# Patient Record
Sex: Male | Born: 1995 | Race: Black or African American | Hispanic: No | Marital: Single | Smoking: Current some day smoker
Health system: Southern US, Community
[De-identification: ages and names within clinical notes are randomized; demographics above are authoritative.]

## PROBLEM LIST (undated history)

## (undated) DIAGNOSIS — R569 Unspecified convulsions: Secondary | ICD-10-CM

## (undated) DIAGNOSIS — D496 Neoplasm of unspecified behavior of brain: Secondary | ICD-10-CM

## (undated) HISTORY — PX: TOOTH EXTRACTION: SUR596

## (undated) HISTORY — DX: Neoplasm of unspecified behavior of brain: D49.6

---

## 2016-06-06 ENCOUNTER — Emergency Department (HOSPITAL_COMMUNITY)
Admission: EM | Admit: 2016-06-06 | Discharge: 2016-06-07 | Disposition: A | Discharge status: Home / Self Care | Payer: Self-pay | Attending: Emergency Medicine | Admitting: Emergency Medicine

## 2016-06-06 DIAGNOSIS — L0291 Cutaneous abscess, unspecified: Secondary | ICD-10-CM

## 2016-06-06 DIAGNOSIS — Z79899 Other long term (current) drug therapy: Secondary | ICD-10-CM | POA: Insufficient documentation

## 2016-06-06 DIAGNOSIS — L02411 Cutaneous abscess of right axilla: Secondary | ICD-10-CM | POA: Insufficient documentation

## 2016-06-06 NOTE — ED Notes (Signed)
Bed: WTR7 Expected date:  Expected time:  Means of arrival:  Comments: 

## 2016-06-06 NOTE — ED Triage Notes (Signed)
Pt presents w/ non-draining abscess to right axilla.

## 2016-06-07 MED ORDER — CEPHALEXIN 500 MG PO CAPS: 500.0000 mg | ORAL_CAPSULE | Freq: Four times a day (QID) | ORAL | 0 refills | Status: DC

## 2016-06-07 MED ORDER — LIDOCAINE HCL 2 % IJ SOLN
10.0000 mL | Freq: Once | INTRAMUSCULAR | Status: AC
  Administered 2016-06-07: 200 mg via INTRADERMAL
  Filled 2016-06-07: qty 20

## 2016-06-07 MED ORDER — HYDROCODONE-ACETAMINOPHEN 5-325 MG PO TABS
2.0000 | ORAL_TABLET | Freq: Once | ORAL | Status: AC
  Administered 2016-06-07: 2 via ORAL
  Filled 2016-06-07: qty 2

## 2016-06-07 MED ORDER — HYDROCODONE-ACETAMINOPHEN 5-325 MG PO TABS: 2.0000 | ORAL_TABLET | ORAL | 0 refills | Status: DC | PRN

## 2016-06-07 MED ORDER — CEPHALEXIN 500 MG PO CAPS
500.0000 mg | ORAL_CAPSULE | Freq: Once | ORAL | Status: AC
  Administered 2016-06-07: 500 mg via ORAL
  Filled 2016-06-07: qty 1

## 2016-06-07 NOTE — Discharge Instructions (Signed)
Take antibiotics as directed.   Make sure to keep the wound clean and dry.  He did take the pain medication as instructed for pain.  Return to emergency Department in 2 days for wound recheck and have the packing removed. If it falls out before then it is okay.  Return the emergency Department sooner if he experiencing worsening swelling/redness to the area, redness that spreads to the arm, fever, any other worsening or concerning symptoms.  If you do not have a primary care doctor you see regularly, please you the list below. Please call them to arrange for follow-up.    No Primary Care Doctor Call [place] Other agencies that provide inexpensive medical care    [place]  [phone]    [facility]  [place]  [phone]    [facility]  [phone]    [facility]  [phone]    [facility]  [phone]

## 2016-06-07 NOTE — ED Provider Notes (Signed)
[place] DEPT Provider Note   CSN: [id number] Arrival date & time: 06/06/16  2250     History   Chief Complaint Chief Complaint  Patient presents with  . Abscess    Right Axilla    HPI Marco Scott is a 21 y.o. male who presents with 1 week of worsening swelling and redness to his right axilla. Patient states that initially the area was very small but over the last week it has continued to grow become more painful. He reports that he has not tried to express any drainage and has not noticed any drainage. He reports a history of abscesses but states that he has never had to get them I&D before.He denies any fever, numbness/weakness of his right upper extremity.   The history is provided by the patient.    No past medical history on file.  There are no active problems to display for this patient.   No past surgical history on file.     Home Medications    Prior to Admission medications   Medication Sig Start Date End Date Taking? Authorizing Provider  cephALEXin (KEFLEX) 500 MG capsule Take 1 capsule (500 mg total) by mouth 4 (four) times daily. 06/07/16   Volanda Napoleon, PA-C  HYDROcodone-acetaminophen (NORCO/VICODIN) 5-325 MG tablet Take 2 tablets by mouth every 4 (four) hours as needed. 06/07/16   Volanda Napoleon, PA-C    Family History No family history on file.  Social History Social History  Substance Use Topics  . Smoking status: Not on file  . Smokeless tobacco: Not on file  . Alcohol use Not on file     Allergies   Patient has no known allergies.   Review of Systems Review of Systems  Constitutional: Negative for fever.  Skin: Positive for wound.  Neurological: Negative for weakness and numbness.     Physical Exam Updated Vital Signs BP 109/70 (BP Location: Left Arm)   Pulse 76   Temp 98.3 F (36.8 C) (Oral)   Resp 18   Ht 5\' 8"  (1.727 m)   Wt 52.2 kg (115 lb)   SpO2 100%   BMI 17.49 kg/m   Physical Exam  Constitutional:  He appears well-developed and well-nourished.  HENT:  Head: Normocephalic and atraumatic.  Eyes: Conjunctivae and EOM are normal. Right eye exhibits no discharge. Left eye exhibits no discharge. No scleral icterus.  Cardiovascular:  Pulses:      Radial pulses are 2+ on the right side, and 2+ on the left side.  Pulmonary/Chest: Effort normal.  Musculoskeletal: He exhibits no deformity.  Neurological: He is alert.  Skin: Skin is warm and dry. Capillary refill takes less than 2 seconds.  3 cm area of erythema with central fluctuance to the right axilla. Tenderness palpation of the area. Active purulent drainage with applied pressure.  Psychiatric: He has a normal mood and affect. His speech is normal and behavior is normal.  Nursing note and vitals reviewed.    ED Treatments / Results  Labs (all labs ordered are listed, but only abnormal results are displayed) Labs Reviewed - No data to display  EKG  EKG Interpretation None       Radiology No results found.  Procedures .Marland KitchenIncision and Drainage Date/Time: 06/07/2016 1:51 AM Performed by: Providence Lanius A Authorized by: Providence Lanius A   Consent:    Consent obtained:  Verbal   Consent given by:  Patient   Risks discussed:  Incomplete drainage, bleeding and infection   Alternatives  discussed:  No treatment Location:    Type:  Abscess   Location:  Upper extremity   Upper extremity location:  Arm   Arm location:  R upper arm (axilla) Pre-procedure details:    Skin preparation:  Betadine Anesthesia (see MAR for exact dosages):    Anesthesia method:  Local infiltration   Local anesthetic:  Lidocaine 2% w/o epi Procedure type:    Complexity:  Simple Procedure details:    Incision types:  Single straight   Incision depth:  Submucosal   Scalpel blade:  11   Wound management:  Probed and deloculated and extensive cleaning   Drainage:  Purulent   Drainage amount:  Moderate   Wound treatment:  Drain placed   Packing  materials:  1/4 in iodoform gauze Post-procedure details:    Patient tolerance of procedure:  Tolerated well, no immediate complications   (including critical care time)  Medications Ordered in ED Medications  lidocaine (XYLOCAINE) 2 % (with pres) injection 200 mg (200 mg Intradermal Given by Other 06/07/16 0114)  HYDROcodone-acetaminophen (NORCO/VICODIN) 5-325 MG per tablet 2 tablet (2 tablets Oral Given 06/07/16 0152)  cephALEXin (KEFLEX) capsule 500 mg (500 mg Oral Given 06/07/16 0152)     Initial Impression / Assessment and Plan / ED Course  I have reviewed the triage vital signs and the nursing notes.  Pertinent labs & imaging results that were available during my care of the patient were reviewed by me and considered in my medical decision making (see chart for details).     21 year old male who presents with swelling and redness to right axilla. Patient is afebrile, non-toxic appearing, sitting comfortably on examination table. History/physical exam are consistent with an abscess to the right axilla. Given that there is a central area of fluctuance and active purulent drainage with applied pressure on physical exam will plan to I&D in the emergency department.  I&D as documented above. Moderate amount of purulent drainage. Packing was placed. Patient tolerated procedure well. Given surrounding erythema will plan to treat with antibiotic. Pain medication and first dose of antibiotic given here in the department. Patient given a short course of pain medication and antibiotics to go home with. Sterile dressing applied. Patient instructed to follow-up in the emergency Department in 2 days for wound recheck and packing removal. Patient is stable for discharge at this time. Strict return precautions discussed. Patient's persistent understanding and agreement to plan.  Final Clinical Impressions(s) / ED Diagnoses   Final diagnoses:  Abscess    New Prescriptions Discharge Medication List  as of 06/07/2016  1:44 AM    START taking these medications   Details  cephALEXin (KEFLEX) 500 MG capsule Take 1 capsule (500 mg total) by mouth 4 (four) times daily., Starting Tue 06/07/2016, Print    HYDROcodone-acetaminophen (NORCO/VICODIN) 5-325 MG tablet Take 2 tablets by mouth every 4 (four) hours as needed., Starting Tue 06/07/2016, Print         Volanda Napoleon, PA-C 06/07/16 0155    Drenda Freeze, MD 06/07/16 1336

## 2016-07-17 ENCOUNTER — Emergency Department (HOSPITAL_COMMUNITY)
Admission: EM | Admit: 2016-07-17 | Discharge: 2016-07-17 | Disposition: A | Discharge status: Home / Self Care | Payer: Self-pay | Attending: Emergency Medicine | Admitting: Emergency Medicine

## 2016-07-17 ENCOUNTER — Emergency Department (HOSPITAL_COMMUNITY): Payer: Self-pay

## 2016-07-17 ENCOUNTER — Encounter (HOSPITAL_COMMUNITY): Payer: Self-pay | Admitting: Emergency Medicine

## 2016-07-17 DIAGNOSIS — F17210 Nicotine dependence, cigarettes, uncomplicated: Secondary | ICD-10-CM | POA: Insufficient documentation

## 2016-07-17 DIAGNOSIS — G479 Sleep disorder, unspecified: Secondary | ICD-10-CM | POA: Insufficient documentation

## 2016-07-17 LAB — CBC
HEMATOCRIT: 40.9 % (ref 39.0–52.0)
Hemoglobin: 14.2 g/dL (ref 13.0–17.0)
MCH: 32.1 pg (ref 26.0–34.0)
MCHC: 34.7 g/dL (ref 30.0–36.0)
MCV: 92.5 fL (ref 78.0–100.0)
Platelets: 269 10*3/uL (ref 150–400)
RBC: 4.42 MIL/uL (ref 4.22–5.81)
RDW: 11.8 % (ref 11.5–15.5)
WBC: 9.4 10*3/uL (ref 4.0–10.5)

## 2016-07-17 LAB — RAPID URINE DRUG SCREEN, HOSP PERFORMED
AMPHETAMINES: NOT DETECTED
BENZODIAZEPINES: NOT DETECTED
Barbiturates: NOT DETECTED
Cocaine: NOT DETECTED
OPIATES: NOT DETECTED
Tetrahydrocannabinol: POSITIVE — AB

## 2016-07-17 LAB — BASIC METABOLIC PANEL
Anion gap: 6 (ref 5–15)
BUN: 15 mg/dL (ref 6–20)
CO2: 27 mmol/L (ref 22–32)
CREATININE: 1.06 mg/dL (ref 0.61–1.24)
Calcium: 9.5 mg/dL (ref 8.9–10.3)
Chloride: 104 mmol/L (ref 101–111)
GFR calc Af Amer: >60 mL/min (ref >60)
GLUCOSE: 86 mg/dL (ref 65–99)
POTASSIUM: 3.6 mmol/L (ref 3.5–5.1)
Sodium: 137 mmol/L (ref 135–145)

## 2016-07-17 LAB — CBG MONITORING, ED: GLUCOSE-CAPILLARY: 72 mg/dL (ref 65–99)

## 2016-07-17 LAB — ETHANOL: Alcohol, Ethyl (B): <5 mg/dL (ref <5)

## 2016-07-17 NOTE — Discharge Instructions (Signed)
This does not sound like a seizure. Your labs are reassuring. Your blood pressure is a low benefit from some oral fluids.

## 2016-07-17 NOTE — ED Triage Notes (Addendum)
Patient states that his roommate woke him up this morning states that he had seizure like activity while sleeping.  Patient was told he was shaking and moaning and bed was wet when he woke up. Patient reports headache this morning and thinks hit on wall .  Patient denies having any PMH of seizures.

## 2016-07-17 NOTE — ED Provider Notes (Signed)
[place] DEPT Provider Note   CSN: [id number] Arrival date & time: 07/17/16  1638     History   Chief Complaint Chief Complaint  Patient presents with  . possible seizure  . Headache    HPI Marco Scott is a 21 y.o. male.  HPI Patient presents after question will seizure activity. States that his roommate came into his room after hearing noises and he was stiffened up in bed. Reportedly was moaning and shaking. Has a headache and thinks he hit his head. Roommate told patient that patient was combative. Patient does not remember this. Sates he woke up he could see his roommate later but states he felt confused. No loss of bladder bowel control. Did not bite his tongue. That was supposed to let after the event from sweating and drooling. No history of seizures. Occasional marijuana use but not yesterday. Denies heavy alcohol use. Denies other drug use. Has been doing well otherwise.   History reviewed. No pertinent past medical history.  There are no active problems to display for this patient.   History reviewed. No pertinent surgical history.     Home Medications    Prior to Admission medications   Medication Sig Start Date End Date Taking? Authorizing Provider  cetirizine (ZYRTEC) 10 MG tablet Take 10 mg by mouth daily.   Yes [provider]    Family History No family history on file.  Social History Social History  Substance Use Topics  . Smoking status: Current Every Day Smoker    Types: Cigarettes  . Smokeless tobacco: Never Used  . Alcohol use Not on file     Allergies   Patient has no known allergies.   Review of Systems Review of Systems  Constitutional: Negative for appetite change.  HENT: Negative for congestion.   Eyes: Negative for photophobia.  Respiratory: Negative for shortness of breath.   Cardiovascular: Negative for chest pain.  Gastrointestinal: Negative for abdominal pain.  Genitourinary: Negative for flank pain.    Musculoskeletal: Negative for back pain.  Neurological: Positive for headaches.  Hematological: Negative for adenopathy.  Psychiatric/Behavioral: Negative for confusion.     Physical Exam Updated Vital Signs BP 98/60 (BP Location: Right Arm)   Pulse 71   Temp 98 F (36.7 C) (Oral)   Resp 18   Wt 50.5 kg (111 lb 4 oz)   SpO2 99%   BMI 16.92 kg/m   Physical Exam  Constitutional: He is oriented to person, place, and time. He appears well-developed.  HENT:  Head: Atraumatic.  No evidence of trauma to tongue  Neck: Neck supple.  Cardiovascular: Normal rate.   Pulmonary/Chest: Effort normal.  Abdominal: He exhibits no distension.  Musculoskeletal: Normal range of motion.  Neurological: He is alert and oriented to person, place, and time.  Skin: Skin is warm. Capillary refill takes less than 2 seconds.  Psychiatric: He has a normal mood and affect.     ED Treatments / Results  Labs (all labs ordered are listed, but only abnormal results are displayed) Labs Reviewed  RAPID URINE DRUG SCREEN, HOSP PERFORMED - Abnormal; Notable for the following:       Result Value   Tetrahydrocannabinol POSITIVE (*)    All other components within normal limits  BASIC METABOLIC PANEL  CBC  ETHANOL  CBG MONITORING, ED    EKG  EKG Interpretation None       Radiology Ct Head Wo Contrast  Result Date: 07/17/2016 CLINICAL DATA:  Headache EXAM: CT HEAD WITHOUT  CONTRAST TECHNIQUE: Contiguous axial images were obtained from the base of the skull through the vertex without intravenous contrast. COMPARISON:  None. FINDINGS: Brain: No acute intracranial abnormality. Specifically, no hemorrhage, hydrocephalus, mass lesion, acute infarction, or significant intracranial injury. Vascular: No hyperdense vessel or unexpected calcification. Skull: No acute calvarial abnormality. Sinuses/Orbits: Visualized paranasal sinuses and mastoids clear. Orbital soft tissues unremarkable. Other: None IMPRESSION:  Normal study. Electronically Signed   By: Rolm Baptise M.D.   On: 07/17/2016 12:50    Procedures Procedures (including critical care time)  Medications Ordered in ED Medications - No data to display   Initial Impression / Assessment and Plan / ED Course  I have reviewed the triage vital signs and the nursing notes.  Pertinent labs & imaging results that were available during my care of the patient were reviewed by me and considered in my medical decision making (see chart for details).      Patient with episode of agitation while sleeping. Sounds like some stiffness but does not sound as if he was actually seizing. Sounds like he was combative while he was stiffened up. Back at baseline now. Mild hypotension but labs reassuring. Patient states he thinks he  Is a little dehydrated. He is tolerating orals will be discharged home.  Final Clinical Impressions(s) / ED Diagnoses   Final diagnoses:  Sleep disturbance    New Prescriptions New Prescriptions   No medications on file     Davonna Belling, MD 07/17/16 1414

## 2016-11-06 ENCOUNTER — Encounter (HOSPITAL_COMMUNITY): Payer: Self-pay

## 2016-11-06 ENCOUNTER — Emergency Department (HOSPITAL_COMMUNITY)
Admission: EM | Admit: 2016-11-06 | Discharge: 2016-11-06 | Disposition: A | Discharge status: Home / Self Care | Payer: Self-pay | Attending: Emergency Medicine | Admitting: Emergency Medicine

## 2016-11-06 DIAGNOSIS — F17210 Nicotine dependence, cigarettes, uncomplicated: Secondary | ICD-10-CM | POA: Insufficient documentation

## 2016-11-06 DIAGNOSIS — R51 Headache: Secondary | ICD-10-CM | POA: Insufficient documentation

## 2016-11-06 DIAGNOSIS — R569 Unspecified convulsions: Secondary | ICD-10-CM | POA: Insufficient documentation

## 2016-11-06 DIAGNOSIS — Z79899 Other long term (current) drug therapy: Secondary | ICD-10-CM | POA: Insufficient documentation

## 2016-11-06 LAB — COMPREHENSIVE METABOLIC PANEL
ALT: 10 U/L — ABNORMAL LOW (ref 17–63)
AST: 29 U/L (ref 15–41)
Albumin: 4.2 g/dL (ref 3.5–5.0)
Alkaline Phosphatase: 61 U/L (ref 38–126)
Anion gap: 5 (ref 5–15)
BUN: 13 mg/dL (ref 6–20)
CO2: 25 mmol/L (ref 22–32)
Calcium: 9.3 mg/dL (ref 8.9–10.3)
Chloride: 102 mmol/L (ref 101–111)
Creatinine, Ser: 0.98 mg/dL (ref 0.61–1.24)
GFR calc Af Amer: >60 mL/min (ref >60)
GFR calc non Af Amer: >60 mL/min (ref >60)
Glucose, Bld: 87 mg/dL (ref 65–99)
Potassium: 4 mmol/L (ref 3.5–5.1)
Sodium: 132 mmol/L — ABNORMAL LOW (ref 135–145)
Total Bilirubin: 0.8 mg/dL (ref 0.3–1.2)
Total Protein: 7.5 g/dL (ref 6.5–8.1)

## 2016-11-06 LAB — CBC WITH DIFFERENTIAL/PLATELET
Basophils Absolute: 0 10*3/uL (ref 0.0–0.1)
Basophils Relative: 0 %
Eosinophils Absolute: 0.1 10*3/uL (ref 0.0–0.7)
Eosinophils Relative: 1 %
HCT: 39.5 % (ref 39.0–52.0)
Hemoglobin: 13.7 g/dL (ref 13.0–17.0)
Lymphocytes Relative: 23 %
Lymphs Abs: 1.2 10*3/uL (ref 0.7–4.0)
MCH: 32.5 pg (ref 26.0–34.0)
MCHC: 34.7 g/dL (ref 30.0–36.0)
MCV: 93.8 fL (ref 78.0–100.0)
Monocytes Absolute: 0.5 10*3/uL (ref 0.1–1.0)
Monocytes Relative: 10 %
Neutro Abs: 3.4 10*3/uL (ref 1.7–7.7)
Neutrophils Relative %: 66 %
Platelets: 235 10*3/uL (ref 150–400)
RBC: 4.21 MIL/uL — ABNORMAL LOW (ref 4.22–5.81)
RDW: 11.7 % (ref 11.5–15.5)
WBC: 5.1 10*3/uL (ref 4.0–10.5)

## 2016-11-06 NOTE — ED Provider Notes (Signed)
[facility] EMERGENCY DEPARTMENT Provider Note   CSN: [id number] Arrival date & time: 11/06/16  8676     History   Chief Complaint No chief complaint on file.   HPI Marco Scott is a 21 y.o. male who is previously healthy who presents with concern following a shaking episode that occurred this morning that was witnessed by the patient's roommate.  Patient does not remember the episode and was disoriented for time after.  He bit his tongue.  He did not have any bowel or bladder incontinence.  He reports he has had 4 episodes like this in the past 6-7 months, the first being in July of this year.  He was evaluated at that time and had a negative head CT.  Patient has since had 3 more episodes witnessed by 2 different roommates.  They report full body shaking.  Patient reports he always feels weird for the rest of the day and has a headache.  He reports that he sometimes smells strange smells prior to his episode of shaking.  Today he reports smelling a soapy, musty smell a little bit before his episode of shaking started.  He denies hitting his head today.  He was in bed when his shaking started.  He reports he smokes marijuana every day.  He states he rarely drinks alcohol.  HPI  History reviewed. No pertinent past medical history.  There are no active problems to display for this patient.   History reviewed. No pertinent surgical history.     Home Medications    Prior to Admission medications   Medication Sig Start Date End Date Taking? Authorizing Provider  cetirizine (ZYRTEC) 10 MG tablet Take 10 mg by mouth daily as needed for allergies.    Yes [provider]  ibuprofen (ADVIL,MOTRIN) 200 MG tablet Take 600 mg by mouth every 6 (six) hours as needed for headache.   Yes [provider]    Family History No family history on file.  Social History Social History  Substance Use Topics  . Smoking status: Current Every Day Smoker   Types: Cigarettes  . Smokeless tobacco: Never Used  . Alcohol use Not on file     Allergies   Patient has no known allergies.   Review of Systems Review of Systems  Constitutional: Negative for chills and fever.  HENT: Negative for facial swelling and sore throat.   Respiratory: Negative for shortness of breath.   Cardiovascular: Negative for chest pain.  Gastrointestinal: Negative for abdominal pain, nausea and vomiting.  Genitourinary: Negative for dysuria.  Musculoskeletal: Negative for back pain.  Skin: Negative for rash and wound.  Neurological: Positive for seizures (potentially) and headaches. Negative for dizziness, light-headedness and numbness.  Psychiatric/Behavioral: The patient is not nervous/anxious.      Physical Exam Updated Vital Signs BP 101/60   Pulse 69   Temp 98 F (36.7 C) (Oral)   Resp 10   Ht 5\' 8"  (1.727 m)   Wt 49.9 kg (110 lb)   SpO2 100%   BMI 16.73 kg/m   Physical Exam  Constitutional: He appears well-developed and well-nourished. No distress.  HENT:  Head: Normocephalic and atraumatic.  Mouth/Throat: Oropharynx is clear and moist. No oropharyngeal exudate.  Eyes: Pupils are equal, round, and reactive to light. Conjunctivae and EOM are normal. Right eye exhibits no discharge. Left eye exhibits no discharge. No scleral icterus.  Neck: Normal range of motion. Neck supple. No thyromegaly present.  Cardiovascular: Normal rate, regular rhythm,  normal heart sounds and intact distal pulses.  Exam reveals no gallop and no friction rub.   No murmur heard. Pulmonary/Chest: Effort normal and breath sounds normal. No stridor. No respiratory distress. He has no wheezes. He has no rales.  Abdominal: Soft. Bowel sounds are normal. He exhibits no distension. There is no tenderness. There is no rebound and no guarding.  Musculoskeletal: He exhibits no edema.  Lymphadenopathy:    He has no cervical adenopathy.  Neurological: He is alert. Coordination  normal.  CN 3-12 intact; normal sensation throughout; 5/5 strength in all 4 extremities; equal bilateral grip strength  Skin: Skin is warm and dry. No rash noted. He is not diaphoretic. No pallor.  Psychiatric: He has a normal mood and affect.  Nursing note and vitals reviewed.    ED Treatments / Results  Labs (all labs ordered are listed, but only abnormal results are displayed) Labs Reviewed  COMPREHENSIVE METABOLIC PANEL - Abnormal; Notable for the following:       Result Value   Sodium 132 (*)    ALT 10 (*)    All other components within normal limits  CBC WITH DIFFERENTIAL/PLATELET - Abnormal; Notable for the following:    RBC 4.21 (*)    All other components within normal limits    EKG  EKG Interpretation  Date/Time:  Sunday November 06 2016 11:52:54 EDT Ventricular Rate:  77 PR Interval:    QRS Duration: 91 QT Interval:  368 QTC Calculation: 417 R Axis:   92 Text Interpretation:  Sinus rhythm Borderline right axis deviation RSR' in V1 or V2, probably normal variant No old tracing to compare Confirmed by Dorie Rank [phone]) on 11/06/2016 11:54:36 AM       Radiology No results found.  Procedures Procedures (including critical care time)  Medications Ordered in ED Medications - No data to display   Initial Impression / Assessment and Plan / ED Course  I have reviewed the triage vital signs and the nursing notes.  Pertinent labs & imaging results that were available during my care of the patient were reviewed by me and considered in my medical decision making (see chart for details).     Patient presenting with suspected seizure.  Labs are unremarkable.  Patient had negative head CT in July for similar symptoms.  EKG shows NSR.  Considering no seizure in the ED and cannot confirm true seizure activity, will refer to neurology for further evaluation with EEG and treatment thereafter.  Patient given resources.  He is advised not to drive, swim, climb to high  heights until seizure-free for 6 months or told otherwise by neurologist.  Return precautions discussed.  Patient understands and agrees with plan.  Patient vitals stable throughout ED course and discharged in satisfactory condition.  I discussed patient case with Dr. Tomi Bamberger who guided the patient's management and agrees with plan.  Final Clinical Impressions(s) / ED Diagnoses   Final diagnoses:  Seizure-like activity [facility])    New Prescriptions Discharge Medication List as of 11/06/2016  1:20 PM       Frederica Kuster, PA-C 11/06/16 1414    Dorie Rank, MD 11/07/16 [phone]

## 2016-11-06 NOTE — ED Triage Notes (Signed)
Patient wants to be checked for possible seizures. States that his roommates describe body jerking x 3 months and he doesn't remember any of this. Today had reported same with tongue biting, no incontinence. Alert and oriented on arrival, complains of headache.

## 2016-11-06 NOTE — Discharge Instructions (Signed)
Do not drive, swim, or climb to high heights until you have been seizure-free for 6 months.  Please follow-up with neurology for further evaluation and treatment of your suspected seizures.  Please return to the emergency department if you develop any new or worsening symptoms.

## 2016-12-17 ENCOUNTER — Inpatient Hospital Stay (HOSPITAL_COMMUNITY)
Admission: EM | Admit: 2016-12-17 | Discharge: 2016-12-19 | DRG: 101 | Disposition: A | Discharge status: Home / Self Care | Payer: Self-pay | Attending: Family Medicine | Admitting: Family Medicine

## 2016-12-17 ENCOUNTER — Encounter (HOSPITAL_COMMUNITY): Payer: Self-pay

## 2016-12-17 ENCOUNTER — Observation Stay (HOSPITAL_COMMUNITY): Payer: Self-pay

## 2016-12-17 ENCOUNTER — Other Ambulatory Visit: Payer: Self-pay

## 2016-12-17 DIAGNOSIS — Z823 Family history of stroke: Secondary | ICD-10-CM

## 2016-12-17 DIAGNOSIS — F1210 Cannabis abuse, uncomplicated: Secondary | ICD-10-CM

## 2016-12-17 DIAGNOSIS — G930 Cerebral cysts: Secondary | ICD-10-CM | POA: Diagnosis present

## 2016-12-17 DIAGNOSIS — D649 Anemia, unspecified: Secondary | ICD-10-CM | POA: Insufficient documentation

## 2016-12-17 DIAGNOSIS — Z8249 Family history of ischemic heart disease and other diseases of the circulatory system: Secondary | ICD-10-CM

## 2016-12-17 DIAGNOSIS — R569 Unspecified convulsions: Principal | ICD-10-CM

## 2016-12-17 DIAGNOSIS — Z809 Family history of malignant neoplasm, unspecified: Secondary | ICD-10-CM

## 2016-12-17 DIAGNOSIS — G9389 Other specified disorders of brain: Secondary | ICD-10-CM

## 2016-12-17 DIAGNOSIS — Z720 Tobacco use: Secondary | ICD-10-CM

## 2016-12-17 DIAGNOSIS — F17200 Nicotine dependence, unspecified, uncomplicated: Secondary | ICD-10-CM

## 2016-12-17 DIAGNOSIS — F17210 Nicotine dependence, cigarettes, uncomplicated: Secondary | ICD-10-CM | POA: Diagnosis present

## 2016-12-17 HISTORY — DX: Unspecified convulsions: R56.9

## 2016-12-17 LAB — CBC WITH DIFFERENTIAL/PLATELET
BASOS PCT: 0 %
Basophils Absolute: 0 10*3/uL (ref 0.0–0.1)
EOS ABS: 0.1 10*3/uL (ref 0.0–0.7)
EOS PCT: 1 %
HCT: 37.8 % — ABNORMAL LOW (ref 39.0–52.0)
HEMOGLOBIN: 12.8 g/dL — AB (ref 13.0–17.0)
Lymphocytes Relative: 17 %
Lymphs Abs: 1.2 10*3/uL (ref 0.7–4.0)
MCH: 31.6 pg (ref 26.0–34.0)
MCHC: 33.9 g/dL (ref 30.0–36.0)
MCV: 93.3 fL (ref 78.0–100.0)
Monocytes Absolute: 0.7 10*3/uL (ref 0.1–1.0)
Monocytes Relative: 10 %
NEUTROS PCT: 72 %
Neutro Abs: 4.9 10*3/uL (ref 1.7–7.7)
PLATELETS: 256 10*3/uL (ref 150–400)
RBC: 4.05 MIL/uL — AB (ref 4.22–5.81)
RDW: 11.7 % (ref 11.5–15.5)
WBC: 6.9 10*3/uL (ref 4.0–10.5)

## 2016-12-17 LAB — COMPREHENSIVE METABOLIC PANEL
ALBUMIN: 3.8 g/dL (ref 3.5–5.0)
ALT: 12 U/L — ABNORMAL LOW (ref 17–63)
ANION GAP: 7 (ref 5–15)
AST: 26 U/L (ref 15–41)
Alkaline Phosphatase: 59 U/L (ref 38–126)
BUN: 14 mg/dL (ref 6–20)
CO2: 24 mmol/L (ref 22–32)
Calcium: 9.4 mg/dL (ref 8.9–10.3)
Chloride: 104 mmol/L (ref 101–111)
Creatinine, Ser: 1.05 mg/dL (ref 0.61–1.24)
GFR calc non Af Amer: >60 mL/min (ref >60)
GLUCOSE: 61 mg/dL — AB (ref 65–99)
POTASSIUM: 3.4 mmol/L — AB (ref 3.5–5.1)
SODIUM: 135 mmol/L (ref 135–145)
Total Bilirubin: 0.7 mg/dL (ref 0.3–1.2)
Total Protein: 6.8 g/dL (ref 6.5–8.1)

## 2016-12-17 LAB — RAPID URINE DRUG SCREEN, HOSP PERFORMED
AMPHETAMINES: NOT DETECTED
BARBITURATES: NOT DETECTED
BENZODIAZEPINES: NOT DETECTED
COCAINE: NOT DETECTED
Opiates: NOT DETECTED
TETRAHYDROCANNABINOL: POSITIVE — AB

## 2016-12-17 LAB — PROTIME-INR
INR: 1.08
Prothrombin Time: 13.9 seconds (ref 11.4–15.2)

## 2016-12-17 MED ORDER — ACETAMINOPHEN 650 MG RE SUPP: 650.0000 mg | RECTAL | Status: DC | PRN

## 2016-12-17 MED ORDER — KETOROLAC TROMETHAMINE 30 MG/ML IJ SOLN
30.0000 mg | Freq: Once | INTRAMUSCULAR | Status: AC
  Administered 2016-12-17: 30 mg via INTRAVENOUS
  Filled 2016-12-17: qty 1

## 2016-12-17 MED ORDER — GADOBENATE DIMEGLUMINE 529 MG/ML IV SOLN
10.0000 mL | Freq: Once | INTRAVENOUS | Status: AC | PRN
  Administered 2016-12-17: 10 mL via INTRAVENOUS

## 2016-12-17 MED ORDER — ONDANSETRON HCL 4 MG PO TABS
4.0000 mg | ORAL_TABLET | Freq: Four times a day (QID) | ORAL | Status: DC | PRN
  Administered 2016-12-18 (×2): 4 mg via ORAL
  Filled 2016-12-17 (×2): qty 1

## 2016-12-17 MED ORDER — LORAZEPAM 2 MG/ML IJ SOLN: 0.5000 mg | INTRAMUSCULAR | Status: DC | PRN

## 2016-12-17 MED ORDER — SODIUM CHLORIDE 0.9 % IV BOLUS (SEPSIS)
1000.0000 mL | Freq: Once | INTRAVENOUS | Status: AC
  Administered 2016-12-17: 1000 mL via INTRAVENOUS

## 2016-12-17 MED ORDER — ONDANSETRON HCL 4 MG/2ML IJ SOLN: 4.0000 mg | Freq: Four times a day (QID) | INTRAMUSCULAR | Status: DC | PRN

## 2016-12-17 MED ORDER — LORAZEPAM 2 MG/ML IJ SOLN
0.5000 mg | Freq: Once | INTRAMUSCULAR | Status: AC
  Administered 2016-12-17: 0.5 mg via INTRAVENOUS
  Filled 2016-12-17: qty 1

## 2016-12-17 MED ORDER — ACETAMINOPHEN 325 MG PO TABS
650.0000 mg | ORAL_TABLET | ORAL | Status: DC | PRN
  Administered 2016-12-18: 650 mg via ORAL
  Filled 2016-12-17: qty 2

## 2016-12-17 MED ORDER — IBUPROFEN 200 MG PO TABS
600.0000 mg | ORAL_TABLET | Freq: Four times a day (QID) | ORAL | Status: DC | PRN
  Administered 2016-12-18: 600 mg via ORAL
  Filled 2016-12-17: qty 3

## 2016-12-17 MED ORDER — POLYETHYLENE GLYCOL 3350 17 G PO PACK: 17.0000 g | PACK | Freq: Every day | ORAL | Status: DC | PRN

## 2016-12-17 MED ORDER — SODIUM CHLORIDE 0.9 % IV SOLN
1000.0000 mg | Freq: Once | INTRAVENOUS | Status: AC
  Administered 2016-12-17: 1000 mg via INTRAVENOUS
  Filled 2016-12-17: qty 10

## 2016-12-17 MED ORDER — SODIUM CHLORIDE 0.9 % IV SOLN
75.0000 mL/h | INTRAVENOUS | Status: DC
  Administered 2016-12-17: 75 mL/h via INTRAVENOUS

## 2016-12-17 NOTE — ED Notes (Signed)
Pt rolling around in the bed crying and yelling that "it feels like something is trying to bust out of my head". EDP made aware.

## 2016-12-17 NOTE — Consult Note (Addendum)
Neurology Consultation  Reason for Consult: Seizures Referring Physician: Dr. Lita Mains  CC: Seizures  History is obtained from: Patient, friend, mother, chart  HPI: Marco Scott is a 21 y.o. male was no significant past medical history but has a history of now at least a fourth witnessed seizure since July 2018, who came in for evaluation of a seizure that happened this afternoon. According to the family, he has had at least 4 seizures that have been witnessed by some friends since July 2018.  He was evaluated in the emergency room with a CAT scan at some point.  He was prescribed Keppra, which he took for 1 month and did not refill and did not follow-up with neurology. He was incarcerated for a while and has been out with an ankle monitoring system on him. He denies any head injury.  Denies any contact sport history.  Mother was at the bedside and reports normal pregnancy when she was pregnant with him.  He had normal growth and development. Uses marijuana at least 3-4 times a week.  Denies heroin cocaine or other drugs. Reports that he has this feeling of his vision becoming black, and his hearing becoming a little prior to these episodes.  Other than the 4 witnessed episodes, there have been at least 2-3 episodes where he is been found down on the floor and he does not remember anything about those episodes. Also has been complaining of headaches after the seizures.  Describes the headaches as sharp pain behind his eyes.  No particular aggravating or relieving factors but happens only after the seizures.  ROS: A 14 point ROS was performed and is negative except as noted in the HPI.   History reviewed. No pertinent past medical history. No past medical history  No family history on file. Significant family history of multiple family members with pancreatic cancer, brain cancer, strokes and diabetes.  Social History:   reports that he has been smoking cigarettes.  he has never used  smokeless tobacco. He reports that he does not use drugs. His alcohol history is not on file. Uses marijuana. Smokes a cigar once every 3 days  Medications No current facility-administered medications for this encounter.   Current Outpatient Medications:  .  cetirizine (ZYRTEC) 10 MG tablet, Take 10 mg by mouth daily as needed for allergies. , Disp: , Rfl:  .  ibuprofen (ADVIL,MOTRIN) 200 MG tablet, Take 600 mg by mouth every 6 (six) hours as needed for headache., Disp: , Rfl:   Exam: Current vital signs: BP 109/79   Pulse 80   Temp 97.7 F (36.5 C) (Oral)   Resp 18   Ht 5\' 8"  (1.727 m)   Wt 52.2 kg (115 lb)   SpO2 100%   BMI 17.49 kg/m   Vital signs in last 24 hours: Temp:  [97.7 F (36.5 C)] 97.7 F (36.5 C) (12/01 1513) Pulse Rate:  [77-80] 80 (12/01 1515) Resp:  [12-18] 18 (12/01 1515) BP: (96-109)/(63-79) 109/79 (12/01 1515) SpO2:  [100 %] 100 % (12/01 1515) Weight:  [52.2 kg (115 lb)] 52.2 kg (115 lb) (12/01 1511)  GENERAL: Awake, alert in NAD HEENT: - Normocephalic and atraumatic, dry mm, no LN++, no Thyromegally.  Tongue bite on the right lateral tongue, neck supple LUNGS - Clear to auscultation bilaterally with no wheezes CV - S1S2 RRR, no m/r/g, equal pulses bilaterally. ABDOMEN - Soft, nontender, nondistended with normoactive BS Ext: warm, well perfused, intact peripheral pulses, no edema, ankle bracelet tracker in place.  NEURO:  Mental Status: AA&Ox3  Language: speech is clear.  Naming, repetition, fluency, and comprehension intact. Cranial Nerves: PERRL. EOMI, visual fields full, no facial asymmetry, facial sensation intact, hearing intact, tongue/uvula/soft palate midline, normal sternocleidomastoid and trapezius muscle strength. No evidence of tongue atrophy or fibrillations Motor: 5/5 all over. Tone: is normal and bulk is normal Sensation- Intact to light touch bilaterally Coordination: FTN intact bilaterally, no ataxia in BLE. Gait- deferred  NIHSS  - 0  Labs I have reviewed labs in epic and the results pertinent to this consultation are: Significant for anemia CBC    Component Value Date/Time   WBC 6.9 12/17/2016 1552   RBC 4.05 (L) 12/17/2016 1552   HGB 12.8 (L) 12/17/2016 1552   HCT 37.8 (L) 12/17/2016 1552   PLT 256 12/17/2016 1552   MCV 93.3 12/17/2016 1552   MCH 31.6 12/17/2016 1552   MCHC 33.9 12/17/2016 1552   RDW 11.7 12/17/2016 1552   LYMPHSABS 1.2 12/17/2016 1552   MONOABS 0.7 12/17/2016 1552   EOSABS 0.1 12/17/2016 1552   BASOSABS 0.0 12/17/2016 1552    CMP     Component Value Date/Time   NA 135 12/17/2016 1552   K 3.4 (L) 12/17/2016 1552   CL 104 12/17/2016 1552   CO2 24 12/17/2016 1552   GLUCOSE 61 (L) 12/17/2016 1552   BUN 14 12/17/2016 1552   CREATININE 1.05 12/17/2016 1552   CALCIUM 9.4 12/17/2016 1552   PROT 6.8 12/17/2016 1552   ALBUMIN 3.8 12/17/2016 1552   AST 26 12/17/2016 1552   ALT 12 (L) 12/17/2016 1552   ALKPHOS 59 12/17/2016 1552   BILITOT 0.7 12/17/2016 1552   GFRNONAA >60 12/17/2016 1552   GFRAA >60 12/17/2016 1552    Imaging I have reviewed the images obtained:  CT-scan of the brain done on7/1/18 -ve for acute process.  Assessment:  21 year old man with no past medical history presenting for evaluation of episode of witnessed seizure by his friend. A good description of seizures not available but he says that he has what sounds to be an auditory and visual aura prior to these episodes.  He has also had episodes that have gone unwitnessed and he has woken up on the floor without any knowledge of it. His exam is essentially normal with the exception of tongue bite on the right lateral tongue, which is strongly suggestive of a seizure bite. He reports using marijuana regularly.  Denies other illicit drugs.  No history of significant head injuries.  No family history of seizures Also has headaches after the seizures.  Impression: -Evaluate for seizures -Might require outpatient  long-term EEG as none of his seizure episodes have been completely witnessed by anybody but most people have only seen it towards the end. -Headache, likely post-ictal  Recommendations: Given the multiplicity of events, continue with Keppra 500 twice daily.  He has already been loaded with Keppra 1 g IV in the emergency room. Obtain routine EEG Obtain MRI with and without contrast Counseled on toabcco and marijuana cessation Symptomatic headache management with Tylenol and IV fluids  Seizure precautions, including [place] state law about no driving for 6 months.  Discussed in detail with patient, and his family members at bedside.  They all verbalized understanding.  Per [facility] statutes, patients with seizures are not allowed to drive until they have been seizure-free for six months.  Use caution when using heavy equipment or power tools. Avoid working on ladders or at heights. Take showers  instead of baths. Ensure the water temperature is not too high on the home water heater. Do not go swimming alone. Do not lock yourself in a room alone (i.e. bathroom). When caring for infants or small children, sit down when holding, feeding, or changing them to minimize risk of injury to the child in the event you have a seizure. Maintain good sleep hygiene. Avoid alcohol.  If patient has another seizure, call 911 and bring them back to the ED if: A. The seizure lasts longer than 5 minutes.  B. The patient doesn't wake shortly after the seizure or has new problems such as difficulty seeing, speaking or moving following the seizure C. The patient was injured during the seizure D. The patient has a temperature over 102 F (39C) E. The patient vomited during the seizure and now is having trouble breathing  -- Amie Portland, MD [facility] [phone] If 7pm to 7am, please call on call as listed on AMION.

## 2016-12-17 NOTE — ED Notes (Signed)
[employer]: [phone] to place ankle bracelet back on

## 2016-12-17 NOTE — ED Notes (Signed)
Attempted to call report x2

## 2016-12-17 NOTE — ED Provider Notes (Signed)
[facility] EMERGENCY DEPARTMENT Provider Note   CSN: [id number] Arrival date & time: 12/17/16  1455     History   Chief Complaint Chief Complaint  Patient presents with  . Seizures    HPI Marco Scott is a 21 y.o. male.  HPI Patient presents with a witnessed seizure while at work.  He is amnestic to the event.  States prior to seizure he had a headache and light sensitivity.  Patient was confused afterward.  Unknown length of seizure-like activity but described as shaking by coworkers.  No incontinence.  Patient had 4 similarly witnessed episodes since he was originally evaluated in July.  So has had several unwitnessed syncopal episodes where he will wake up on the floor.  He had a CT in July which was normal.  Patient admits to frequent marijuana use but denies any other drug or alcohol use.  Denies frequent caffeinated or energy drinks.  Currently denies headache, no focal weakness or numbness.  No neck pain or stiffness. History reviewed. No pertinent past medical history.  Patient Active Problem List   Diagnosis Date Noted  . Seizure ([place]) 12/17/2016    History reviewed. No pertinent surgical history.     Home Medications    Prior to Admission medications   Medication Sig Start Date End Date Taking? Authorizing Provider  cetirizine (ZYRTEC) 10 MG tablet Take 10 mg by mouth daily as needed for allergies.     [provider]  ibuprofen (ADVIL,MOTRIN) 200 MG tablet Take 600 mg by mouth every 6 (six) hours as needed for headache.    [provider]    Family History No family history on file.  Social History Social History   Tobacco Use  . Smoking status: Current Every Day Smoker    Types: Cigarettes  . Smokeless tobacco: Never Used  Substance Use Topics  . Alcohol use: Not on file  . Drug use: No     Allergies   Patient has no known allergies.   Review of Systems Review of Systems  Constitutional: Negative for  chills and fever.  HENT: Negative for facial swelling.   Eyes: Positive for photophobia.  Respiratory: Negative for shortness of breath.   Cardiovascular: Negative for chest pain.  Gastrointestinal: Negative for abdominal pain, diarrhea, nausea and vomiting.  Genitourinary: Negative for flank pain and frequency.  Musculoskeletal: Negative for back pain, myalgias and neck pain.  Skin: Negative for rash and wound.  Neurological: Positive for seizures, syncope and headaches. Negative for dizziness, weakness, light-headedness and numbness.  Psychiatric/Behavioral: Positive for confusion.  All other systems reviewed and are negative.    Physical Exam Updated Vital Signs BP 109/79   Pulse 80   Temp 97.7 F (36.5 C) (Oral)   Resp 18   Ht 5\' 8"  (1.727 m)   Wt 52.2 kg (115 lb)   SpO2 100%   BMI 17.49 kg/m   Physical Exam  Constitutional: He is oriented to person, place, and time. He appears well-developed and well-nourished. No distress.  HENT:  Head: Normocephalic and atraumatic.  Mouth/Throat: Oropharynx is clear and moist.  Small abrasion to the right lateral side of the tongue.  Eyes: EOM are normal. Pupils are equal, round, and reactive to light.  Neck: Normal range of motion. Neck supple.  No posterior midline cervical tenderness to palpation.  No meningismus.  Cardiovascular: Normal rate and regular rhythm. Exam reveals no gallop and no friction rub.  No murmur heard. Pulmonary/Chest: Effort normal and breath  sounds normal. No stridor. No respiratory distress. He has no wheezes. He has no rales. He exhibits no tenderness.  Abdominal: Soft. Bowel sounds are normal. There is no tenderness. There is no rebound and no guarding.  Musculoskeletal: Normal range of motion. He exhibits no edema or tenderness.  No midline thoracic or lumbar tenderness.  No lower extremity swelling, asymmetry or tenderness.  Neurological: He is alert and oriented to person, place, and time.  Patient is  alert and oriented x3 with clear, goal oriented speech. Patient has 5/5 motor in all extremities. Sensation is intact to light touch.  Skin: Skin is warm and dry. Capillary refill takes less than 2 seconds. No rash noted. He is not diaphoretic. No erythema.  Psychiatric: He has a normal mood and affect. His behavior is normal.  Nursing note and vitals reviewed.    ED Treatments / Results  Labs (all labs ordered are listed, but only abnormal results are displayed) Labs Reviewed  CBC WITH DIFFERENTIAL/PLATELET - Abnormal; Notable for the following components:      Result Value   RBC 4.05 (*)    Hemoglobin 12.8 (*)    HCT 37.8 (*)    All other components within normal limits  COMPREHENSIVE METABOLIC PANEL - Abnormal; Notable for the following components:   Potassium 3.4 (*)    Glucose, Bld 61 (*)    ALT 12 (*)    All other components within normal limits  RAPID URINE DRUG SCREEN, HOSP PERFORMED  PROTIME-INR    EKG  EKG Interpretation None       Radiology No results found.  Procedures Procedures (including critical care time)  Medications Ordered in ED Medications  LORazepam (ATIVAN) injection 0.5 mg (not administered)  ketorolac (TORADOL) 30 MG/ML injection 30 mg (not administered)  levETIRAcetam (KEPPRA) 1,000 mg in sodium chloride 0.9 % 100 mL IVPB (1,000 mg Intravenous New Bag/Given 12/17/16 1608)  sodium chloride 0.9 % bolus 1,000 mL (1,000 mLs Intravenous New Bag/Given 12/17/16 1557)     Initial Impression / Assessment and Plan / ED Course  I have reviewed the triage vital signs and the nursing notes.  Pertinent labs & imaging results that were available during my care of the patient were reviewed by me and considered in my medical decision making (see chart for details).    Discussed with neurology who evaluated the patient in the emergency department.  Recommend loading with IV Keppra getting MRI and EEG and overnight admission.  Discussed with family medicine  service who will admit.   Final Clinical Impressions(s) / ED Diagnoses   Final diagnoses:  Seizure [facility])    ED Discharge Orders    None       Julianne Rice, MD 12/17/16 1705

## 2016-12-17 NOTE — H&P (Signed)
[facility] Admission History and Physical Service Pager: [phone]  Patient name: Marco Scott Medical record number: [id number] Date of birth: 12/14/1995 Age: 21 y.o. Gender: male  Primary Care Provider: Patient, No Pcp Per Consultants: neurology Code Status: full  Chief Complaint: seizure  Assessment and Plan: Marco Scott is a 21 y.o. male presenting with seizure episode earlier today. PMH is significant for allergies, daily marijuana use.  Witnessed seizure- history strongly suspicious of true seizures coupled with bite on his tongue however no one in room had witnessed an episode and just described the episodes of shaking. Patient does describe seizure aura and post-ictal state. BMP largely normal, CBC with mild anemia. Glucose 61. Neurology consulted in ED, keppra load ordered and will admit for seizure work up. Patient received 0.5 mg ativan in ED for agitation and persistent headache. -place in observation, attending Dr. Erin Hearing -vitals per floor w/ pulse ox -q4 hour neuro checks -seizure precautions -obtain EEG -continue Keppra oral 500 mg BID -MRI w/wo contrast (of note patient has ankle monitoring bracelet on and will need this removed prior to MRI) -tylenol or ibuprofen as needed for headache -if further seizures will give IV ativan -counseled on no driving for 6 months  Headache- per patient typically occur after seizure episodes. Received toradol in ED. -tylenol as needed  -IVF at maintenance  Marijuana use- frequent marijuana use reported -encourage cessation -UDS pending  Tobacco use- current every day smoker -encourage cessation -can order nicotine patch if patient needs it  Anemia- mild anemia noted on labs, hemoglobin 12.8, MCV 93.3. In July Hgb was 14.2.  -am CBC  Allergies- seasonal -cetirizine as needed   FEN/GI: regular diet Prophylaxis: early ambulation  Disposition: pending work up   History of Present  Illness:  Marco Scott is a 21 y.o. male presenting with seizure earlier today while at work.  Patient was at work and felt fine. He then noticed his hearing was "fading" and his vision was going black. Was found on floor by co-workers shaking. They called EMS. He is currently complaining of headache behind his eyes mostly. Reports he has had multiple episodes similar to this since July of this year. Some have been witnessed by his roommates, others he has just been found on the floor and he has been confused and can't remember what happened.   This episode no loss of bladder or bowel function. He did bite his tongue. He was confused for 5-10 minutes afterwards but that resolved. Headache remains and he reports he always gets headaches with his prior seizure episodes.  He first experienced a seizure like event in July, was evaluated in ED and had normal work up including normal head CT. He came back in October for seizure and was discharged to follow up with neurology but he was afraid he couldn't afford it and did not go.   Frequent to daily marijuana use otherwise no significant [place]  Review Of Systems: Per HPI with the following additions:   Review of Systems  Constitutional: Negative for chills and fever.  HENT: Negative for congestion and sore throat.   Eyes: Negative for blurred vision and double vision.  Respiratory: Negative for cough and shortness of breath.   Cardiovascular: Negative for chest pain.  Gastrointestinal: Negative for constipation, nausea and vomiting.  Genitourinary: Negative for dysuria, frequency and urgency.  Musculoskeletal: Negative for myalgias.  Skin: Negative for rash.  Neurological: Positive for seizures, loss of consciousness and headaches. Negative for tremors, sensory change  and speech change.  Psychiatric/Behavioral: Positive for substance abuse. Negative for hallucinations.    Patient Active Problem List   Diagnosis Date Noted  . Seizure ([place])  12/17/2016    Past Medical History: History reviewed. No pertinent past medical history.  Past Surgical History: History reviewed. No pertinent surgical history.  Social History: Social History   Tobacco Use  . Smoking status: Current Every Day Smoker    Types: Cigarettes  . Smokeless tobacco: Never Used  Substance Use Topics  . Alcohol use: Not on file  . Drug use: No   Additional social history: lives with roommate, smokes marijuana frequently, sexually active with 1 male partner  Please also refer to relevant sections of EMR.  Family History: No family history on file.  No FH of seizures known to family. FH of heart disease, cancer, and strokes in grandparents.  Allergies and Medications: No Known Allergies No current facility-administered medications on file prior to encounter.    Current Outpatient Medications on File Prior to Encounter  Medication Sig Dispense Refill  . cetirizine (ZYRTEC) 10 MG tablet Take 10 mg by mouth daily as needed for allergies.     Marland Kitchen ibuprofen (ADVIL,MOTRIN) 200 MG tablet Take 600 mg by mouth every 6 (six) hours as needed for headache.      Objective: BP 109/79   Pulse 80   Temp 97.7 F (36.5 C) (Oral)   Resp 18   Ht 5\' 8"  (1.727 m)   Wt 115 lb (52.2 kg)   SpO2 100%   BMI 17.49 kg/m  Exam: General: laying in bed, sleeping but arousable, in NAD Eyes: PERRLA, EOMI ENTM: MMM, tongue laceration on right side Neck: supple, non-tender, no LAD Cardiovascular: RRR, no MRG Respiratory: CTAB. No increased WOB Gastrointestinal: soft, non-tender, non-distended, +BS MSK: moves all extremities equally Derm: warm, dry, no rashes or lesions appreciated Neuro: AOx3, no focal deficits, strength and sensation in tact throughout Psych: appropriate mood and affect  Labs and Imaging: CBC BMET  Recent Labs  Lab 12/17/16 1552  WBC 6.9  HGB 12.8*  HCT 37.8*  PLT 256   Recent Labs  Lab 12/17/16 1552  NA 135  K 3.4*  CL 104  CO2 24   BUN 14  CREATININE 1.05  GLUCOSE 61*  CALCIUM 9.4      Steve Rattler, DO 12/17/2016, 4:59 PM PGY-2, [place] [facility] pager: [phone], text pages welcome

## 2016-12-17 NOTE — ED Notes (Signed)
Pt brought in by [facility] for an unwitnessed seizure that occurred while pt was at work. Pt states he sat on the ground because he got really sleepy, and the next thing he remembers is being at the hospital. Pt has had seizure-like activity for the past 4 months and was dx with seizures during a previous visit. Pt was given follow up information for a neurologist but never followed up. Pt is currently A+Ox4 and his only complaint is of a headache. Pt does not take any daily medications. Pt states before all of his episodes he gets a sudden severe headache and becomes very "tired".

## 2016-12-17 NOTE — ED Notes (Signed)
Attempted to call report x 1  

## 2016-12-17 NOTE — ED Notes (Signed)
Pt arrived via [facility] and when at the bridge waiting to be registered, jumped off of the stretcher and attempted to run out of the EMS bay. Pt states he does not know where he is but "I know you people are trying to help me".

## 2016-12-18 ENCOUNTER — Encounter (HOSPITAL_COMMUNITY): Payer: Self-pay

## 2016-12-18 ENCOUNTER — Observation Stay (HOSPITAL_COMMUNITY): Payer: Self-pay

## 2016-12-18 DIAGNOSIS — G939 Disorder of brain, unspecified: Secondary | ICD-10-CM

## 2016-12-18 DIAGNOSIS — F17200 Nicotine dependence, unspecified, uncomplicated: Secondary | ICD-10-CM

## 2016-12-18 LAB — RETICULOCYTES
RBC.: 3.84 MIL/uL — ABNORMAL LOW (ref 4.22–5.81)
RETIC COUNT ABSOLUTE: 49.9 10*3/uL (ref 19.0–186.0)
Retic Ct Pct: 1.3 % (ref 0.4–3.1)

## 2016-12-18 LAB — CBC
HEMATOCRIT: 34.5 % — AB (ref 39.0–52.0)
Hemoglobin: 11.6 g/dL — ABNORMAL LOW (ref 13.0–17.0)
MCH: 31.7 pg (ref 26.0–34.0)
MCHC: 33.6 g/dL (ref 30.0–36.0)
MCV: 94.3 fL (ref 78.0–100.0)
PLATELETS: 245 10*3/uL (ref 150–400)
RBC: 3.66 MIL/uL — ABNORMAL LOW (ref 4.22–5.81)
RDW: 11.8 % (ref 11.5–15.5)
WBC: 5.8 10*3/uL (ref 4.0–10.5)

## 2016-12-18 LAB — IRON AND TIBC
IRON: 121 ug/dL (ref 45–182)
Saturation Ratios: 52 % — ABNORMAL HIGH (ref 17.9–39.5)
TIBC: 231 ug/dL — ABNORMAL LOW (ref 250–450)
UIBC: 110 ug/dL

## 2016-12-18 LAB — VITAMIN B12: Vitamin B-12: 197 pg/mL (ref 180–914)

## 2016-12-18 LAB — BASIC METABOLIC PANEL
ANION GAP: 5 (ref 5–15)
BUN: 12 mg/dL (ref 6–20)
CALCIUM: 8.6 mg/dL — AB (ref 8.9–10.3)
CO2: 20 mmol/L — AB (ref 22–32)
Chloride: 112 mmol/L — ABNORMAL HIGH (ref 101–111)
Creatinine, Ser: 0.95 mg/dL (ref 0.61–1.24)
Glucose, Bld: 79 mg/dL (ref 65–99)
Potassium: 3.6 mmol/L (ref 3.5–5.1)
Sodium: 137 mmol/L (ref 135–145)

## 2016-12-18 LAB — FERRITIN: Ferritin: 78 ng/mL (ref 24–336)

## 2016-12-18 LAB — HIV ANTIBODY (ROUTINE TESTING W REFLEX): HIV SCREEN 4TH GENERATION: NONREACTIVE

## 2016-12-18 LAB — FOLATE: FOLATE: 19.6 ng/mL (ref >5.9)

## 2016-12-18 MED ORDER — DEXAMETHASONE SODIUM PHOSPHATE 4 MG/ML IJ SOLN: 4.0000 mg | Freq: Four times a day (QID) | INTRAMUSCULAR | Status: DC

## 2016-12-18 MED ORDER — PHENOL 1.4 % MT LIQD
1.0000 | OROMUCOSAL | Status: DC | PRN
  Filled 2016-12-18: qty 177

## 2016-12-18 MED ORDER — LEVETIRACETAM 500 MG PO TABS
500.0000 mg | ORAL_TABLET | Freq: Two times a day (BID) | ORAL | Status: DC
  Administered 2016-12-18 – 2016-12-19 (×3): 500 mg via ORAL
  Filled 2016-12-18 (×3): qty 1

## 2016-12-18 NOTE — Procedures (Signed)
ELECTROENCEPHALOGRAM REPORT  Date of Study: 12/18/16  Patient's Name: Marco Scott MRN: [id number] Date of Birth: 25-Dec-1995  Referring Provider: Julianne Rice, MD  Clinical History: [name] is a 21 y.o. male without significant past medical history but has a history of now at least a fourth witnessed seizure since July 2018, who came in for evaluation of a seizure.  Has been prescribed Keppra in the past. Reports that he has this feeling of his vision becoming black, and his hearing becoming a little prior to these episodes.  Other than the 4 witnessed episodes, there have been at least 2-3 episodes where he is been found down on the floor and he does not remember anything about those episodes. Also has been complaining of headaches after the seizures.    Medications: Scheduled Meds: . levETIRAcetam  500 mg Oral BID   Continuous Infusions: PRN Meds:.acetaminophen **OR** acetaminophen, ibuprofen, LORazepam, ondansetron **OR** ondansetron (ZOFRAN) IV, polyethylene glycol            Technical Summary: This is a standard 16 channel EEG recording performed according to the international 10-20 electrode system.  AP bipolar, transverse bipolar, and referential montages were obtained, and digitally reformatted as necessary.  Duration of Tracing: 22:04  Description: In the awake state there is a 10 Hz alpha rhythm seen from the posterior head regions in a symmetric fashion.  Eye blink artifact noted.  Drowsiness is noted by dropout of background alpha and vertex slowing, and stage 2 sleep with sleep spindles.   Hyperventilation and photic stimulation were performed without notable changes.  EKG was monitored and noted to be sinus rhythym with an average heart rate of 78 bpm.  Impression: This is a normal EEG in the awake, drowsy, and sleep states without any epileptiform abnormalities noted.  A single normal EEG does not exclude the diagnosis of epilepsy.  Clinical correlation  advised.   Carvel Getting, M.D.

## 2016-12-18 NOTE — Progress Notes (Signed)
PROGRESS NOTE  EEG completed and reported  normal.  No new recs.  C/w Keppra  F/U with Dr. Mickeal Skinner. Primary team to contact Monday.  Inpatient neurology will be available as needed.  Please call with questions.  Amie Portland, MD [facility] [phone] If 7pm to 7am, please call on call as listed on AMION.

## 2016-12-18 NOTE — Progress Notes (Signed)
EEG completed, results pending. 

## 2016-12-18 NOTE — Plan of Care (Signed)
  Health Behavior/Discharge Planning: Ability to manage health-related needs will improve 12/18/2016 2029 - Progressing by Irish Lack, RN   Clinical Measurements: Ability to maintain clinical measurements within normal limits will improve 12/18/2016 2029 - Progressing by Irish Lack, RN   Safety: Ability to remain free from injury will improve 12/18/2016 2029 - Progressing by Irish Lack, RN

## 2016-12-18 NOTE — Progress Notes (Signed)
Family Medicine Teaching Service Daily Progress Note Intern Pager: [phone]  Patient name: Marco Scott Medical record number: [id number] Date of birth: December 23, 1995 Age: 21 y.o. Gender: male  Primary Care Provider: Patient, No Pcp Per Consultants: neurology  Code Status: Full   Assessment and Plan: Marco Scott is a 21 y.o. male presenting with seizure episode earlier today. PMH is significant for allergies, daily marijuana use.  Witnessed seizure- history strongly suspicious of true seizures. Patient does describe seizure aura and post-ictal state.  Neurology consulted in ED, keppra load ordered and will admit for seizure work up. MRI w/wo contrast - 17 x 14 x 15 mm cortically based well-circumscribed cystic mass involving the anterior left superior frontal gyrus as above. -q4 hour neuro checks -seizure precautions -obtain EEG -continue Keppra oral 500 mg BID -neurology rec  Headache- per patient typically occur after seizure episodes. Received toradol in ED. -tylenol and ibuprofen for headaches  -IVF at maintenance  Marijuana use- frequent marijuana use reported -encourage cessation -UDS positive   Tobacco use- current every day smoker -encourage cessation -can order nicotine patch if patient needs it  Anemia- In July Hgb was 14.2. Mild Anemia - hgb 11.6  - Anemia panel  - FOBT - HIV  Allergies- seasonal -cetirizine as needed   FEN/GI: regular diet Prophylaxis: early ambulation  Disposition: Home   Subjective:  Patient doing well. States he was a little nauseated this morning, better with ginger ale. Concerned about the tumor neurology discussed with him. Tried to provide reassurance   Objective: Temp:  [97.7 F (36.5 C)-98.2 F (36.8 C)] 98.2 F (36.8 C) (12/02 0400) Pulse Rate:  [65-89] 67 (12/02 0400) Resp:  [12-20] 18 (12/02 0400) BP: (84-109)/(42-79) 102/56 (12/02 0400) SpO2:  [93 %-100 %] 99 % (12/02 0400) Weight:  [115 lb (52.2 kg)-115 lb 15.4  oz (52.6 kg)] 115 lb 15.4 oz (52.6 kg) (12/01 2040) Physical Exam: General: Well appearing, NAD Cardiovascular: RRR, no murmurs  Respiratory: CTAB, normal WOB  Abdomen: BS+, non-tender, non -distended  Extremities: No lower extremity edema   Laboratory: Recent Labs  Lab 12/17/16 1552 12/18/16 0316  WBC 6.9 5.8  HGB 12.8* 11.6*  HCT 37.8* 34.5*  PLT 256 245   Recent Labs  Lab 12/17/16 1552 12/18/16 0316  NA 135 137  K 3.4* 3.6  CL 104 112*  CO2 24 20*  BUN 14 12  CREATININE 1.05 0.95  CALCIUM 9.4 8.6*  PROT 6.8  --   BILITOT 0.7  --   ALKPHOS 59  --   ALT 12*  --   AST 26  --   GLUCOSE 61* 79     Imaging/Diagnostic Tests: Mr Jeri Cos Wo Contrast  Result Date: 12/17/2016 CLINICAL DATA:  Initial evaluation for acute seizure. EXAM: MRI HEAD WITHOUT AND WITH CONTRAST TECHNIQUE: Multiplanar, multiecho pulse sequences of the brain and surrounding structures were obtained without and with intravenous contrast. CONTRAST:  32mL MULTIHANCE GADOBENATE DIMEGLUMINE 529 MG/ML IV SOLN COMPARISON:  Prior head CT from 07/17/2016. FINDINGS: Brain: Cerebral volume within normal limits for age. There is a well-circumscribed slightly expansile T2 hyperintense cortically based cystic lesion at the parasagittal anterior left frontal lobe at the level of the superior frontal gyrus, measuring 17 x 14 x 15 mm (AP by transverse by craniocaudad). Lesion appears to be intra-axial, and demonstrates minimal internal complexity. No associated diffusion abnormality or susceptibility artifact. No appreciable enhancement on post-contrast sequences. No significant edema within the surrounding brain parenchyma. No obvious cortical dysplasia or  organizational abnormality within this region. Upon review of prior CT, this lesion was present on that exam, in overall similar in size. Note made of a few probable punctate calcifications within this lesion on prior CT. Cerebral volume within normal limits. No other focal  parenchymal signal abnormality. No foci of restricted diffusion to suggest acute or subacute ischemia. Gray-white matter differentiation otherwise well maintained. No encephalomalacia to suggest chronic infarction. No foci of susceptibility artifact to suggest acute or chronic intracranial hemorrhage. No other mass lesion. No midline shift or mass effect. Ventricles normal in size without hydrocephalus. No extra-axial fluid collection. Major dural sinuses are grossly patent. Pituitary gland suprasellar region grossly normal. Midline structures intact and normal. Vascular: Major intracranial vascular flow voids are well maintained. Skull and upper cervical spine: Craniocervical junction normal. Upper cervical spine within normal limits. Bone marrow signal intensity normal. No scalp soft tissue abnormality. Sinuses/Orbits: Globes and orbital soft tissues within normal limits. Mild scattered mucosal thickening within the ethmoidal air cells and maxillary sinuses. Paranasal sinuses are otherwise clear. No mastoid effusion. Inner ear structures grossly normal. Other: None. IMPRESSION: 1. 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 No associated edema or mass effect. Finding is nonspecific, with primary differential considerations consisting of possible DNET versus ganglioglioma. No disorganization or migrational abnormality to suggest focal cortical dysplasia. While lesions such as oligodendroglioma and PXA could also conceivably be considered, these are less favored given the relative lack of intrinsic and adjacent dural enhancement. 2. Otherwise normal MRI of the brain. Electronically Signed   By: Jeannine Boga M.D.   On: 12/17/2016 23:44    Tonette Bihari, MD 12/18/2016, 8:31 AM PGY-3, [facility] Intern pager: [phone], text pages welcome

## 2016-12-18 NOTE — Progress Notes (Signed)
[employer]: called at [phone] to place ankle bracelet back on.  No answer, message left on machine.  Call back number left

## 2016-12-18 NOTE — Progress Notes (Signed)
NEURO HOSPITALIST PROGRESS NOTE  Subjective: No family at bedside. Patient found laying in bed in NAD. Continues to complain of frontal H/a, pain behind his eyes and generalized weakness. States feels nauseous when he tries to eat. No new acute events or seizure activity reported overnight  Exam: Temp:  [97.7 F (36.5 C)-98.5 F (36.9 C)] 98.5 F (36.9 C) (12/02 1002) Pulse Rate:  [65-89] 77 (12/02 1002) Resp:  [12-20] 18 (12/02 1002) BP: (84-115)/(42-79) 115/62 (12/02 1002) SpO2:  [93 %-100 %] 100 % (12/02 1002) Weight:  [52.2 kg (115 lb)-52.6 kg (115 lb 15.4 oz)] 52.6 kg (115 lb 15.4 oz) (12/01 2040)  GENERAL: Awake, alert in NAD HEENT: - Normocephalic and atraumatic, Tongue bite on the right lateral tongue, neck supple LUNGS - Clear to auscultation bilaterally with no wheezes CV - S1S2 RRR, no m/r/g, equal pulses bilaterally. ABDOMEN - Soft, nontender, nondistended with normoactive BS Ext: warm, well perfused, intact peripheral pulses, no edema.  NEURO:  Mental Status: AA&Ox3  Language: speech is clear.  Naming, repetition, fluency, and comprehension intact. Cranial Nerves: PERRL. EOMI, visual fields full, no facial asymmetry, facial sensation intact, hearing intact, tongue/uvula/soft palate midline, normal sternocleidomastoid and trapezius muscle strength. No evidence of tongue atrophy or fibrillations Motor: 5/5 all over. Some poor effort on strength testing Tone: is normal and bulk is normal Sensation- Intact to light touch bilaterally Coordination: FTN intact bilaterally, no ataxia in BLE. Gait- deferred. No truncal ataxia noted  Pertinent Labs/Diagnostics: No results for input(s): GLUCAP in the last 168 hours. Recent Labs  Lab 12/17/16 1552 12/18/16 0316  NA 135 137  K 3.4* 3.6  CL 104 112*  CO2 24 20*  GLUCOSE 61* 79  BUN 14 12  CREATININE 1.05 0.95  CALCIUM 9.4 8.6*   Recent Labs  Lab 12/17/16 1552 12/18/16 0316  WBC 6.9 5.8  NEUTROABS  4.9  --   HGB 12.8* 11.6*  HCT 37.8* 34.5*  MCV 93.3 94.3  PLT 256 245   IMAGING: I have personally reviewed the radiological images below and agree with the radiology interpretations. CT-scan of the brain done on7/1/18 -ve for acute process.  Mr Jeri Cos Wo Contrast Result Date: 12/17/2016 IMPRESSION: 1. 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 No associated edema or mass effect. Finding is nonspecific, with primary differential considerations consisting of possible DNET versus ganglioglioma. No disorganization or migrational abnormality to suggest focal cortical dysplasia. While lesions such as oligodendroglioma and PXA could also conceivably be considered, these are less favored given the relative lack of intrinsic and adjacent dural enhancement. 2. Otherwise normal MRI of the brain.   EEG -     PENDING  Assessment:  21 year old man with no past medical history presenting for evaluation of episode of witnessed seizure by his friend. A good description of seizures not available but he says that he has what sounds to be an auditory and visual aura prior to these episodes.  He has also had episodes that have gone unwitnessed and he has woken up on the floor without any knowledge of it. His exam is essentially normal with the exception of tongue bite on the right lateral tongue, which is strongly suggestive of a seizure bite. He reports using marijuana regularly.  Denies other illicit drugs.  No history of significant head injuries.  No family history of  seizures Also has headaches after the seizures. 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  Impression: -Evaluate for seizures- may be secondary to brain lesion -Might require outpatient long-term EEG as none of his seizure episodes have been completely witnessed by anybody but most people have only seen it towards the  end. -Headache,  post-ictal vs pressure from brain lesion  12/18/16: MRI results/ brain lesion reviewed with patient. Will contact Neuro Oncology Dr Mickeal Skinner for consultation. No seizure activity reported overnight. EEG remains pending. Continues to C/o H/A, eye pain and generalized weakness.  Recommendations: -Consult Neuro Oncology on Monday - Dr Earnest Conroy. Mike Gip. Could consider neurosugery consult after consultation with Dr. Mickeal Skinner.  -Continue with Keppra 500 twice daily.  -Routine EEG -  Completed. Formal read pending. -Symptomatic headache management with Tylenol and IV fluids -PT consultation Seizure precautions, including [place] state law about no driving for 6 months.  Discussed in detail with patient, and his family members at bedside.  They all verbalized understanding.  Per [facility] statutes, patients with seizures are not allowed to drive until they have been seizure-free for six months.  Use caution when using heavy equipment or power tools. Avoid working on ladders or at heights. Take showers instead of baths. Ensure the water temperature is not too high on the home water heater. Do not go swimming alone. Do not lock yourself in a room alone (i.e. bathroom). When caring for infants or small children, sit down when holding, feeding, or changing them to minimize risk of injury to the child in the event you have a seizure. Maintain good sleep hygiene. Avoid alcohol.  If patient has another seizure, call 911 and bring them back to the ED if: A. The seizure lasts longer than 5 minutes.  B. The patient doesn't wake shortly after the seizure or has new problems such as difficulty seeing, speaking or moving following the seizure C. The patient was injured during the seizure D. The patient has a temperature over 102 F (39C) E. The patient vomited during the seizure and now is having trouble breathing  Mary Sella. ANP-C [facility] 12/18/2016, 11:01 AM If  7pm to 7am, please call on call as listed on AMION.  Attending Neurohospitalist Addendum Patient seen and examined. Agree with the history and physical as documented above. Agree with the plan as documented, which I helped formulate. I have independently examined the patient, gathered history, review of systems and reviewed images. I have made changes to the note above to reflect my plan.   --- Amie Portland, MD [facility] [phone]  If 7pm to 7am, please call on call as listed on AMION.

## 2016-12-19 DIAGNOSIS — G4089 Other seizures: Secondary | ICD-10-CM

## 2016-12-19 DIAGNOSIS — G9389 Other specified disorders of brain: Secondary | ICD-10-CM

## 2016-12-19 MED ORDER — LEVETIRACETAM 500 MG PO TABS: 500.0000 mg | ORAL_TABLET | Freq: Two times a day (BID) | ORAL | 0 refills | Status: DC

## 2016-12-19 NOTE — Consult Note (Signed)
[name] CONSULT NOTE  Patient Care Team: Patient, No Pcp Per as PCP - General (General Practice)  CHIEF COMPLAINTS/PURPOSE OF CONSULTATION:  Seizure Abnormal Brain MRI  HISTORY OF PRESENTING ILLNESS:  Marco Scott 21 y.o. male presented on day of arrival after being found down by coworkers, with shaking consistent with a seizure.  Apparently there had been several prior similar events since the summer, although all had occurred at home in the evening. When he arrived to the ED he was confused and combative.  He eventually returned to baseline status after administration of Keppra, ativan.  A subsequent MRI brain demonstrated a non-enhancing well circumscribed lesion in the mesial left frontal lobe.  Today, he has no formal complaints and feels ready for discharge.  He denies neurologic deficits at this time.    MEDICAL HISTORY:  Past Medical History:  Diagnosis Date  . Seizure ([place])    FOR LAST 4 MONTHS    SURGICAL HISTORY: History reviewed. No pertinent surgical history.  SOCIAL HISTORY: Social History   Socioeconomic History  . Marital status: Single    Spouse name: Not on file  . Number of children: Not on file  . Years of education: Not on file  . Highest education level: Not on file  Social Needs  . Financial resource strain: Somewhat hard  . Food insecurity - worry: Patient refused  . Food insecurity - inability: Patient refused  . Transportation needs - medical: Patient refused  . Transportation needs - non-medical: Patient refused  Occupational History  . Not on file  Tobacco Use  . Smoking status: Current Every Day Smoker    Types: Cigarettes  . Smokeless tobacco: Never Used  Substance and Sexual Activity  . Alcohol use: No    Frequency: Never  . Drug use: No  . Sexual activity: Not Currently  Other Topics Concern  . Not on file  Social History Narrative   Currently on house arrest and wearing ankle monitor    FAMILY HISTORY: History  reviewed. No pertinent family history.  ALLERGIES:  has No Known Allergies.  MEDICATIONS:  Current Facility-Administered Medications  Medication Dose Route Frequency Provider Last Rate Last Dose  . acetaminophen (TYLENOL) tablet 650 mg  650 mg Oral Q4H PRN Lucila Maine C, DO   650 mg at 12/18/16 1056   Or  . acetaminophen (TYLENOL) suppository 650 mg  650 mg Rectal Q4H PRN Lucila Maine C, DO      . ibuprofen (ADVIL,MOTRIN) tablet 600 mg  600 mg Oral Q6H PRN Lucila Maine C, DO   600 mg at 12/18/16 1827  . levETIRAcetam (KEPPRA) tablet 500 mg  500 mg Oral BID Candise Che A, NP   500 mg at 12/19/16 1000  . LORazepam (ATIVAN) injection 0.5 mg  0.5 mg Intravenous Q2H PRN Steve Rattler, DO      . ondansetron (ZOFRAN) tablet 4 mg  4 mg Oral Q6H PRN Lucila Maine C, DO   4 mg at 12/18/16 1827   Or  . ondansetron (ZOFRAN) injection 4 mg  4 mg Intravenous Q6H PRN Riccio, Angela C, DO      . phenol (CHLORASEPTIC) mouth spray 1 spray  1 spray Mouth/Throat PRN Chambliss, Marshall L, MD      . polyethylene glycol (MIRALAX / GLYCOLAX) packet 17 g  17 g Oral Daily PRN Lucila Maine C, DO        REVIEW OF SYSTEMS:   Constitutional: Denies fevers, chills or abnormal weight loss  Eyes: Denies blurriness of vision Ears, nose, mouth, throat, and face: Denies mucositis or sore throat Respiratory: Denies cough, dyspnea or wheezes Cardiovascular: Denies palpitation, chest discomfort or lower extremity swelling Gastrointestinal:  Denies nausea, constipation, diarrhea GU: Denies dysuria or incontinence Skin: Denies abnormal skin rashes Neurological: Per HPI Musculoskeletal: Denies joint pain, back or neck discomfort. No decrease in ROM Behavioral/Psych: +anxiety   PHYSICAL EXAMINATION: Vitals:   12/19/16 0125 12/19/16 0957  BP: 110/71 107/65  Pulse: 71 61  Resp: 18 16  Temp: 98.1 F (36.7 C) 98.2 F (36.8 C)  SpO2: 100% 100%   KPS: 90. General: Thin appearing. Alert, cooperative,  pleasant, but anxious and mood labile Head: Normal EENT: No conjunctival injection or scleral icterus. Oral mucosa moist Lungs: Resp effort normal Cardiac: Regular rate and rhythm Abdomen: Soft, non-distended abdomen Skin: No rashes cyanosis or petechiae. Extremities: No clubbing or edema  NEUROLOGIC EXAM: Mental Status: Awake, alert, attentive to examiner. Oriented to self and environment. Language is fluent with intact comprehension.  Cranial Nerves: Visual acuity is grossly normal. Visual fields are full. Extra-ocular movements intact. No ptosis. Face is symmetric, tongue midline. Motor: Tone and bulk are normal. Power is full in both arms and legs. Reflexes are symmetric, no pathologic reflexes present. Intact finger to nose bilaterally Sensory: Intact to light touch and temperature Gait: Normal and tandem gait is normal.   LABORATORY DATA:  I have reviewed the data as listed Lab Results  Component Value Date   WBC 5.8 12/18/2016   HGB 11.6 (L) 12/18/2016   HCT 34.5 (L) 12/18/2016   MCV 94.3 12/18/2016   PLT 245 12/18/2016   Recent Labs    11/06/16 1033 12/17/16 1552 12/18/16 0316  NA 132* 135 137  K 4.0 3.4* 3.6  CL 102 104 112*  CO2 25 24 20*  GLUCOSE 87 61* 79  BUN 13 14 12   CREATININE 0.98 1.05 0.95  CALCIUM 9.3 9.4 8.6*  GFRNONAA >60 >60 >60  GFRAA >60 >60 >60  PROT 7.5 6.8  --   ALBUMIN 4.2 3.8  --   AST 29 26  --   ALT 10* 12*  --   ALKPHOS 61 59  --   BILITOT 0.8 0.7  --     RADIOGRAPHIC STUDIES: I have personally reviewed the radiological images as listed and agreed with the findings in the report.  Mr Jeri Cos Wo Contrast  Result Date: 12/17/2016 CLINICAL DATA:  Initial evaluation for acute seizure. EXAM: MRI HEAD WITHOUT AND WITH CONTRAST TECHNIQUE: Multiplanar, multiecho pulse sequences of the brain and surrounding structures were obtained without and with intravenous contrast. CONTRAST:  37mL MULTIHANCE GADOBENATE DIMEGLUMINE 529 MG/ML IV SOLN  COMPARISON:  Prior head CT from 07/17/2016. FINDINGS: Brain: Cerebral volume within normal limits for age. There is a well-circumscribed slightly expansile T2 hyperintense cortically based cystic lesion at the parasagittal anterior left frontal lobe at the level of the superior frontal gyrus, measuring 17 x 14 x 15 mm (AP by transverse by craniocaudad). Lesion appears to be intra-axial, and demonstrates minimal internal complexity. No associated diffusion abnormality or susceptibility artifact. No appreciable enhancement on post-contrast sequences. No significant edema within the surrounding brain parenchyma. No obvious cortical dysplasia or organizational abnormality within this region. Upon review of prior CT, this lesion was present on that exam, in overall similar in size. Note made of a few probable punctate calcifications within this lesion on prior CT. Cerebral volume within normal limits. No other focal parenchymal signal abnormality.  No foci of restricted diffusion to suggest acute or subacute ischemia. Gray-white matter differentiation otherwise well maintained. No encephalomalacia to suggest chronic infarction. No foci of susceptibility artifact to suggest acute or chronic intracranial hemorrhage. No other mass lesion. No midline shift or mass effect. Ventricles normal in size without hydrocephalus. No extra-axial fluid collection. Major dural sinuses are grossly patent. Pituitary gland suprasellar region grossly normal. Midline structures intact and normal. Vascular: Major intracranial vascular flow voids are well maintained. Skull and upper cervical spine: Craniocervical junction normal. Upper cervical spine within normal limits. Bone marrow signal intensity normal. No scalp soft tissue abnormality. Sinuses/Orbits: Globes and orbital soft tissues within normal limits. Mild scattered mucosal thickening within the ethmoidal air cells and maxillary sinuses. Paranasal sinuses are otherwise clear. No mastoid  effusion. Inner ear structures grossly normal. Other: None. IMPRESSION: 1. 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 No associated edema or mass effect. Finding is nonspecific, with primary differential considerations consisting of possible DNET versus ganglioglioma. No disorganization or migrational abnormality to suggest focal cortical dysplasia. While lesions such as oligodendroglioma and PXA could also conceivably be considered, these are less favored given the relative lack of intrinsic and adjacent dural enhancement. 2. Otherwise normal MRI of the brain. Electronically Signed   By: Jeannine Boga M.D.   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  I will present his case in multi-disciplinary tumor board on 12/26/16.  In the meantime, I recommend that he obtain a follow up MRI brain in 2-3 months.  Our team will order this and all future studies as well as any follow up visits.    He should return to see me in clinic following this next MRI study.    Please discharge with 3 months supply of Keppra 500mg  BID.  I will follow and treat his seizures.  If present mood lability persists, an alternative AED may be necessary.           Today I counseled him extensively on epilepsy and risk factors for provocation such as sleep deprivation, illness, stress, alcohol/drug use.    Per [place] law he should not drive until seizure free for 6 months.  All questions were answered. The patient knows to call the clinic with any problems, questions or concerns.  The total time spent in the encounter was 80 minutes and more than 50% was on counseling and review of test results      Ventura Sellers, MD 12/19/2016 2:17 PM

## 2016-12-19 NOTE — Discharge Instructions (Signed)
You were admitted for seizures. You were seen by neurology and also neuro oncology, due to a mass seen on MRI. You will need to have a repeat MRI in 2-3 months (please contact the neuro-oncologist to find out when this is scheduled). You will also need a follow up appointment with that office.   You have been discharged with 3 months of Keppra 500mg  taken twice a day.   YOU MAY NOT DRIVE UNTIL SEIZURE FREE FOR 6 MONTHS!   Please follow up with your PCP as soon as possible.  You may call and make an appointment with [facility] within 1 week of discharge if you do not have a PCP.   If you develop further seizures, dizziness, delirium, or confusion please come to the ED.
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
## 2016-12-19 NOTE — Care Management Note (Signed)
Case Management Note  Patient Details  Name: Marco Scott MRN: [id number] Date of Birth: 1995/09/01  Subjective/Objective:                    Action/Plan: Pt discharged home with self care. Pt to f/u with [facility] as his PCP. CM was going to provide medication assistance card but patient left without card. Bedside RN to call and see if patient would like to return for card.  Pt had transportation home.  Expected Discharge Date:  12/19/16               Expected Discharge Plan:  Home/Self Care  In-House Referral:     Discharge planning Services  CM Consult  Post Acute Care Choice:    Choice offered to:     DME Arranged:    DME Agency:     HH Arranged:    HH Agency:     Status of Service:  In process, will continue to follow  If discussed at Long Length of Stay Meetings, dates discussed:    Additional Comments:  Pollie Friar, RN 12/19/2016, 3:53 PM

## 2016-12-19 NOTE — Discharge Summary (Signed)
[facility] Discharge Summary  Patient name: Marco Scott Medical record number: [id number] Date of birth: [birth year] Age: 21 y.o. Gender: male Date of Admission: 12/17/2016  Date of Discharge: 12/19/16 Admitting Physician: Lind Covert, MD  Primary Care Provider: Patient, No Pcp Per Consultants: neurology, neuro oncology   Indication for Hospitalization: witnessed seizure   Discharge Diagnoses/Problem List:  Witnessed seizure Headache Marijuana use Tobacco use Anemia  Allergies   Disposition: to home  Discharge Condition: stable, improving   Discharge Exam:  Please see progress note from date of discharge   Brief Hospital Course:  Marco Scott is a 21 y.o. male presenting after witnessed seizure. History was suspicious for true seizures and patient was seen by neurology while inpatient. EEG was performed and was normal. 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 Patient was put on Keppra 500mg  bid and seen by Dr. Mike Gip, neuro-oncology while inpatient. Per recommendations of Dr. Mike Gip, patient was discharged with 3 months supply Keppra and was recommended to obtain a follow up MRI brain in 2-3 months.   Issues for Follow Up:  1. Patient seen by Neuro oncology: will need MRI of brain in 2-3 months. Needs continued follow up by neuro oncology after next MRI study.  2. Discharged with 3 month supply Keppra 500mg  bid  3. Patient should not drive until seizure free for 6 months  4. Recommended drug and tobacco cessation. Patient interested in quitting but does not want patches or gums during admission  5. Monitor Hgb, anemic during admission. Anemia panel during admission showing normal ferritin and iron, normal B12   Significant Procedures: none   Significant Labs and Imaging:  Recent Labs  Lab 12/17/16 1552 12/18/16 0316  WBC 6.9 5.8  HGB 12.8* 11.6*   HCT 37.8* 34.5*  PLT 256 245   Recent Labs  Lab 12/17/16 1552 12/18/16 0316  NA 135 137  K 3.4* 3.6  CL 104 112*  CO2 24 20*  GLUCOSE 61* 79  BUN 14 12  CREATININE 1.05 0.95  CALCIUM 9.4 8.6*  ALKPHOS 59  --   AST 26  --   ALT 12*  --   ALBUMIN 3.8  --     Mr Jeri Cos Wo Contrast  Result Date: 12/17/2016 CLINICAL DATA:  Initial evaluation for acute seizure. EXAM: MRI HEAD WITHOUT AND WITH CONTRAST TECHNIQUE: Multiplanar, multiecho pulse sequences of the brain and surrounding structures were obtained without and with intravenous contrast. CONTRAST:  21mL MULTIHANCE GADOBENATE DIMEGLUMINE 529 MG/ML IV SOLN COMPARISON:  Prior head CT from 07/17/2016. FINDINGS: Brain: Cerebral volume within normal limits for age. There is a well-circumscribed slightly expansile T2 hyperintense cortically based cystic lesion at the parasagittal anterior left frontal lobe at the level of the superior frontal gyrus, measuring 17 x 14 x 15 mm (AP by transverse by craniocaudad). Lesion appears to be intra-axial, and demonstrates minimal internal complexity. No associated diffusion abnormality or susceptibility artifact. No appreciable enhancement on post-contrast sequences. No significant edema within the surrounding brain parenchyma. No obvious cortical dysplasia or organizational abnormality within this region. Upon review of prior CT, this lesion was present on that exam, in overall similar in size. Note made of a few probable punctate calcifications within this lesion on prior CT. Cerebral volume within normal limits. No other focal parenchymal signal abnormality. No foci of restricted diffusion to suggest acute or subacute ischemia. Gray-white matter differentiation otherwise well maintained. No encephalomalacia  to suggest chronic infarction. No foci of susceptibility artifact to suggest acute or chronic intracranial hemorrhage. No other mass lesion. No midline shift or mass effect. Ventricles normal in size  without hydrocephalus. No extra-axial fluid collection. Major dural sinuses are grossly patent. Pituitary gland suprasellar region grossly normal. Midline structures intact and normal. Vascular: Major intracranial vascular flow voids are well maintained. Skull and upper cervical spine: Craniocervical junction normal. Upper cervical spine within normal limits. Bone marrow signal intensity normal. No scalp soft tissue abnormality. Sinuses/Orbits: Globes and orbital soft tissues within normal limits. Mild scattered mucosal thickening within the ethmoidal air cells and maxillary sinuses. Paranasal sinuses are otherwise clear. No mastoid effusion. Inner ear structures grossly normal. Other: None. IMPRESSION: 1. 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 No associated edema or mass effect. Finding is nonspecific, with primary differential considerations consisting of possible DNET versus ganglioglioma. No disorganization or migrational abnormality to suggest focal cortical dysplasia. While lesions such as oligodendroglioma and PXA could also conceivably be considered, these are less favored given the relative lack of intrinsic and adjacent dural enhancement. 2. Otherwise normal MRI of the brain. Electronically Signed   By: Jeannine Boga M.D.   On: 12/17/2016 23:44    Results/Tests Pending at Time of Discharge:  Unresulted Labs (From admission, onward)   None      Discharge Medications:  Allergies as of 12/19/2016   No Known Allergies     Medication List    TAKE these medications   ibuprofen 200 MG tablet Commonly known as:  ADVIL,MOTRIN Take 600 mg by mouth every 6 (six) hours as needed for headache.   levETIRAcetam 500 MG tablet Commonly known as:  KEPPRA Take 1 tablet (500 mg total) by mouth 2 (two) times daily.       Discharge Instructions: Please refer to Patient Instructions section of EMR for full  details.  Patient was counseled important signs and symptoms that should prompt return to medical care, changes in medications, dietary instructions, activity restrictions, and follow up appointments.   Follow-Up Appointments: Follow-up Information    Vaslow, Acey Lav, MD. Schedule an appointment as soon as possible for a visit in 1 week(s).   Specialties:  Psychiatry, Neurology, Oncology Contact information: [facility] [address] [place]. Schedule an appointment as soon as possible for a visit in 2 day(s).   Specialty:  Family Medicine Why:  You may call and make a follow up appointment with your clinic within a week of discharge Contact information: [address] [id number] [place] [address] [place], [place], [place] 12/20/2016, 2:23 PM PGY-1, [place]

## 2016-12-19 NOTE — Progress Notes (Signed)
Patient discharged home. Discharge instructions were reviewed with the patient and mother. Patient and mother verbalized understanding.

## 2016-12-19 NOTE — Progress Notes (Signed)
PT Cancellation Note  Patient Details Name: Marco Scott MRN: [id number] DOB: Feb 28, 1995   Cancelled Treatment:    Reason Eval/Treat Not Completed: (P) PT screened, no needs identified, will sign off  Peyton Rossner B. Migdalia Dk PT, DPT Acute Rehabilitation  [phone] Pager [phone]   [place] 12/19/2016, 3:15 PM

## 2016-12-20 ENCOUNTER — Other Ambulatory Visit: Payer: Self-pay | Admitting: Internal Medicine

## 2016-12-20 ENCOUNTER — Telehealth: Payer: Self-pay | Admitting: *Deleted

## 2016-12-20 DIAGNOSIS — D496 Neoplasm of unspecified behavior of brain: Secondary | ICD-10-CM

## 2016-12-20 NOTE — Telephone Encounter (Signed)
Patients mother called to see about getting a follow up appointment for her son. She states that per the hospital discharge instructions he is to get a follow up within a week with Dr. Mickeal Skinner.  Confirmed per Dr. Mickeal Skinner and his notes that patient does not need to be seen until after his next scan.    Called to advise mother and inform of upcoming appointments and had to leave a message.    Next MRI scheduled and Dr. Mickeal Skinner appointment both scheduled.    Mailing copy of calendar to patient.

## 2016-12-21 ENCOUNTER — Inpatient Hospital Stay: Payer: Self-pay | Admitting: Family Medicine

## 2016-12-22 ENCOUNTER — Telehealth: Payer: Self-pay | Admitting: *Deleted

## 2016-12-22 NOTE — Telephone Encounter (Signed)
Received call from patients mother Oris Drone.  She states that the patient since being discharged has had numerous headaches that are progressively getting worse.  Reports nausea/photosenitivity/decreased appetite/headache originating in the front and going into the eyes.  Patients mother reports that he has used tylenol and cold compresses without effect.    Reviewed with Dr. Mickeal Skinner and instructed to obtain visit for evaluation of headaches.  Appointment scheduled for tomorrow.  Instructed to continue regimen to assist with headaches and to hydrate.  Mother expressed understanding and acceptance to instructions.

## 2016-12-23 ENCOUNTER — Other Ambulatory Visit: Payer: Self-pay

## 2016-12-23 ENCOUNTER — Ambulatory Visit (HOSPITAL_BASED_OUTPATIENT_CLINIC_OR_DEPARTMENT_OTHER): Payer: Self-pay | Admitting: Internal Medicine

## 2016-12-23 ENCOUNTER — Encounter: Payer: Self-pay | Admitting: Internal Medicine

## 2016-12-23 VITALS — BP 108/70 | HR 82 | Temp 98.8°F | Resp 20 | Wt 105.2 lb

## 2016-12-23 DIAGNOSIS — G939 Disorder of brain, unspecified: Secondary | ICD-10-CM

## 2016-12-23 DIAGNOSIS — R569 Unspecified convulsions: Secondary | ICD-10-CM

## 2016-12-23 DIAGNOSIS — G9389 Other specified disorders of brain: Secondary | ICD-10-CM

## 2016-12-23 NOTE — Progress Notes (Signed)
[facility] at [facility] [place], [place] [address] [phone]   New Patient Evaluation  Date of Service: 12/23/16 Patient Name: Marco Scott Patient MRN: [id number] Patient DOB: 08/21/1995 Provider: Ventura Sellers, MD  Identifying Statement:  Marco Scott is a 21 y.o. male with left frontal brain mass who presents for initial consultation and evaluation.    Referring Provider: No referring provider defined for this encounter.  History of Present Illness: The patient's records from the referring physician were obtained and reviewed and the patient interviewed to confirm this HPI.  Marco Scott  presented to medical attention last week after being found down by coworkers, with witnessed shaking consistent with a seizure.  Apparently there had been several prior similar events since the summer, although all had occurred at home in the evening. When he arrived to the ED he was confused and combative.  He eventually returned to baseline status after administration of Keppra, ativan.  A subsequent MRI brain demonstrated a non-enhancing well circumscribed lesion in the mesial left frontal lobe.  He was discharged to home without any rehab needs.  Today, he complains of occasional frontal headaches, anxiety about his tumor.  He has been compliant with Keppra which has been well tolerated, and has had no further seizures. He denies new or progressive neurologic deficits.   Medications: Current Outpatient Medications on File Prior to Visit  Medication Sig Dispense Refill  . ibuprofen (ADVIL,MOTRIN) 200 MG tablet Take 600 mg by mouth every 6 (six) hours as needed for headache.    . levETIRAcetam (KEPPRA) 500 MG tablet Take 1 tablet (500 mg total) by mouth 2 (two) times daily. 180 tablet 0   No current facility-administered medications on file prior to visit.     Allergies: No Known Allergies Past Medical History:  Past Medical History:    Diagnosis Date  . Seizure ([place])    FOR LAST 4 MONTHS   Past Surgical History: No past surgical history on file. Social History:  Social History   Socioeconomic History  . Marital status: Single    Spouse name: Not on file  . Number of children: Not on file  . Years of education: Not on file  . Highest education level: Not on file  Social Needs  . Financial resource strain: Somewhat hard  . Food insecurity - worry: Patient refused  . Food insecurity - inability: Patient refused  . Transportation needs - medical: Patient refused  . Transportation needs - non-medical: Patient refused  Occupational History  . Not on file  Tobacco Use  . Smoking status: Current Every Day Smoker    Types: Cigarettes  . Smokeless tobacco: Never Used  Substance and Sexual Activity  . Alcohol use: No    Frequency: Never  . Drug use: No  . Sexual activity: Not Currently  Other Topics Concern  . Not on file  Social History Narrative   Currently on house arrest and wearing ankle monitor   Family History: No family history on file.  Review of Systems: Constitutional: Denies fevers, chills or abnormal weight loss Eyes: Denies blurriness of vision Ears, nose, mouth, throat, and face: Denies mucositis or sore throat Respiratory: Denies cough, dyspnea or wheezes Cardiovascular: Denies palpitation, chest discomfort or lower extremity swelling Gastrointestinal:  Denies nausea, constipation, diarrhea GU: Denies dysuria or incontinence Skin: Denies abnormal skin rashes Neurological: Per HPI Musculoskeletal: Denies joint pain, back or neck discomfort. No decrease in ROM Behavioral/Psych: Denies anxiety, disturbance  in thought content, and mood instability  Physical Exam: Vitals:   12/23/16 0955  BP: 108/70  Pulse: 82  Resp: 20  Temp: 98.8 F (37.1 C)  SpO2: 100%   KPS: 100. General: Alert, cooperative, pleasant, in no acute distress Head: Normal EENT: No conjunctival injection or scleral  icterus. Oral mucosa moist Lungs: Resp effort normal Cardiac: Regular rate and rhythm Abdomen: Soft, non-distended abdomen Skin: No rashes cyanosis or petechiae. Extremities: No clubbing or edema  Neurologic Exam: Mental Status: Awake, alert, attentive to examiner. Oriented to self and environment. Language is fluent with intact comprehension.  Cranial Nerves: Visual acuity is grossly normal. Visual fields are full. Extra-ocular movements intact. No ptosis. Face is symmetric, tongue midline. Motor: Tone and bulk are normal. Power is full in both arms and legs. Reflexes are symmetric, no pathologic reflexes present. Intact finger to nose bilaterally Sensory: Intact to light touch and temperature Gait: Normal and tandem gait is normal.   Labs: I have reviewed the data as listed    Component Value Date/Time   NA 137 12/18/2016 0316   K 3.6 12/18/2016 0316   CL 112 (H) 12/18/2016 0316   CO2 20 (L) 12/18/2016 0316   GLUCOSE 79 12/18/2016 0316   BUN 12 12/18/2016 0316   CREATININE 0.95 12/18/2016 0316   CALCIUM 8.6 (L) 12/18/2016 0316   PROT 6.8 12/17/2016 1552   ALBUMIN 3.8 12/17/2016 1552   AST 26 12/17/2016 1552   ALT 12 (L) 12/17/2016 1552   ALKPHOS 59 12/17/2016 1552   BILITOT 0.7 12/17/2016 1552   GFRNONAA >60 12/18/2016 0316   GFRAA >60 12/18/2016 0316   Lab Results  Component Value Date   WBC 5.8 12/18/2016   NEUTROABS 4.9 12/17/2016   HGB 11.6 (L) 12/18/2016   HCT 34.5 (L) 12/18/2016   MCV 94.3 12/18/2016   PLT 245 12/18/2016    Imaging: [place] Clinician Interpretation: I have personally reviewed the CNS images as listed.  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 EXAM: MRI HEAD WITHOUT AND WITH CONTRAST TECHNIQUE: Multiplanar, multiecho pulse sequences of the brain and surrounding structures were  obtained without and with intravenous contrast. CONTRAST:  59mL MULTIHANCE GADOBENATE DIMEGLUMINE 529 MG/ML IV SOLN COMPARISON:  Prior head CT from 07/17/2016. FINDINGS: Brain: Cerebral volume within normal limits for age. There is a well-circumscribed slightly expansile T2 hyperintense cortically based cystic lesion at the parasagittal anterior left frontal lobe at the level of the superior frontal gyrus, measuring 17 x 14 x 15 mm (AP by transverse by craniocaudad). Lesion appears to be intra-axial, and demonstrates minimal internal complexity. No associated diffusion abnormality or susceptibility artifact. No appreciable enhancement on post-contrast sequences. No significant edema within the surrounding brain parenchyma. No obvious cortical dysplasia or organizational abnormality within this region. Upon review of prior CT, this lesion was present on that exam, in overall similar in size. Note made of a few probable punctate calcifications within this lesion on prior CT. Cerebral volume within normal limits. No other focal parenchymal signal abnormality. No foci of restricted diffusion to suggest acute or subacute ischemia. Gray-white matter differentiation otherwise well maintained. No encephalomalacia to suggest chronic infarction. No foci of susceptibility artifact to suggest acute or chronic intracranial hemorrhage. No other mass lesion. No midline shift or mass effect. Ventricles normal in size without hydrocephalus. No extra-axial fluid collection. Major  dural sinuses are grossly patent. Pituitary gland suprasellar region grossly normal. Midline structures intact and normal. Vascular: Major intracranial vascular flow voids are well maintained. Skull and upper cervical spine: Craniocervical junction normal. Upper cervical spine within normal limits. Bone marrow signal intensity normal. No scalp soft tissue abnormality. Sinuses/Orbits: Globes and orbital soft tissues within normal limits. Mild scattered  mucosal thickening within the ethmoidal air cells and maxillary sinuses. Paranasal sinuses are otherwise clear. No mastoid effusion. Inner ear structures grossly normal. Other: None. IMPRESSION: 1. 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 No associated edema or mass effect. Finding is nonspecific, with primary differential considerations consisting of possible DNET versus ganglioglioma. No disorganization or migrational abnormality to suggest focal cortical dysplasia. While lesions such as oligodendroglioma and PXA could also conceivably be considered, these are less favored given the relative lack of intrinsic and adjacent dural enhancement. 2. Otherwise normal MRI of the brain. Electronically Signed   By: Jeannine Boga M.D.   On: 12/17/2016 23:44    Pathology:  Assessment/Plan 1. Brain mass 2. Seizure [facility])  We appreciate the opportunity to participate in the care of [facility]  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  We have scheduled a follow up MRI for early February, at which time his case will be discussed in multi-disciplinary brain tumor board.  We recommended lifestyle modification for headaches, including stress reduction and sleep hygiene.  He may use ibuprofen as needed for prolonged headaches.    At this time he should continue Keppra at current dose.  We spent twenty additional minutes teaching regarding the natural history, biology, and historical experience in the treatment of brain tumors.   All questions were answered. The patient knows to call the clinic with any problems, questions or concerns. No barriers to learning were detected.  The total time spent in the encounter was 45 minutes and more than 50% was on counseling and review of test results   Ventura Sellers, MD Medical Director of Neuro-Oncology [facility] at  [place] 12/23/16 9:49 AM

## 2017-01-22 ENCOUNTER — Encounter (HOSPITAL_COMMUNITY): Payer: Self-pay | Admitting: Emergency Medicine

## 2017-01-22 ENCOUNTER — Emergency Department (HOSPITAL_COMMUNITY)
Admission: EM | Admit: 2017-01-22 | Discharge: 2017-01-22 | Discharge status: Against Medical Advice | Payer: Medicaid Other | Attending: Emergency Medicine | Admitting: Emergency Medicine

## 2017-01-22 ENCOUNTER — Other Ambulatory Visit: Payer: Self-pay

## 2017-01-22 DIAGNOSIS — R569 Unspecified convulsions: Secondary | ICD-10-CM | POA: Insufficient documentation

## 2017-01-22 DIAGNOSIS — F17210 Nicotine dependence, cigarettes, uncomplicated: Secondary | ICD-10-CM | POA: Insufficient documentation

## 2017-01-22 DIAGNOSIS — R410 Disorientation, unspecified: Secondary | ICD-10-CM | POA: Diagnosis not present

## 2017-01-22 DIAGNOSIS — R51 Headache: Secondary | ICD-10-CM | POA: Insufficient documentation

## 2017-01-22 DIAGNOSIS — Z79899 Other long term (current) drug therapy: Secondary | ICD-10-CM | POA: Insufficient documentation

## 2017-01-22 DIAGNOSIS — D496 Neoplasm of unspecified behavior of brain: Secondary | ICD-10-CM

## 2017-01-22 NOTE — Discharge Instructions (Signed)
SEE YOUR OUTPATIENT DOCTOR AS SOON AS POSSIBLE AND TAKE ALL YOUR MEDICATIONS WITHOUT MISSING DOSES. RETURN TO ER IF ANY NEW SYMPTOMS OR ANY REPEAT SEIZURE ACTIVITY.

## 2017-01-22 NOTE — ED Triage Notes (Signed)
Pt to ED with c/o seizure.  Mom st's pt was postictal for a while afterwards.  Pt was dx with a brain tumor last month.  Pt c/o headache at this time

## 2017-01-22 NOTE — ED Provider Notes (Signed)
[facility] EMERGENCY DEPARTMENT Provider Note   CSN: [id number] Arrival date & time: 01/22/17  1540     History   Chief Complaint Chief Complaint  Patient presents with  . Seizures    HPI Marco Scott is a 22 y.o. male.  22 year old male with past medical history including brain tumor, seizure disorder who presents with seizure.  Patient was in his room by himself asleep and was found on the floor, mom found him confused and he did not remember calling her.  He states that he has had a gradual onset of headache and bit his tongue.  No incontinence.  The symptoms are classic for his usual seizure activity, last seizure was in December.  He states that he missed 1 dose of his seizure medication today.  He denies any vision changes, extremity weakness/numbness, vomiting, fevers, or other complaints.   The history is provided by the patient.  Seizures      Past Medical History:  Diagnosis Date  . Seizure ([place])    FOR LAST 4 MONTHS    Patient Active Problem List   Diagnosis Date Noted  . Brain mass   . Seizure ([place]) 12/17/2016  . Marijuana abuse   . Tobacco use disorder   . Anemia     History reviewed. No pertinent surgical history.     Home Medications    Prior to Admission medications   Medication Sig Start Date End Date Taking? Authorizing Provider  ibuprofen (ADVIL,MOTRIN) 200 MG tablet Take 600 mg by mouth every 6 (six) hours as needed for headache.    [provider]  levETIRAcetam (KEPPRA) 500 MG tablet Take 1 tablet (500 mg total) by mouth 2 (two) times daily. 12/19/16 03/19/17  Steve Rattler, DO    Family History No family history on file.  Social History Social History   Tobacco Use  . Smoking status: Current Every Day Smoker    Types: Cigarettes  . Smokeless tobacco: Never Used  Substance Use Topics  . Alcohol use: No    Frequency: Never  . Drug use: No     Allergies   Patient has no known  allergies.   Review of Systems Review of Systems  Neurological: Positive for seizures.   All other systems reviewed and are negative except that which was mentioned in HPI   Physical Exam Updated Vital Signs BP 107/66 (BP Location: Right Arm)   Pulse 70   Temp 98.7 F (37.1 C) (Oral)   Resp 14   SpO2 100%   Physical Exam  Constitutional: He is oriented to person, place, and time. He appears well-developed and well-nourished. No distress.  Awake, alert, sitting in chair  HENT:  Head: Normocephalic and atraumatic.  Contusion tip of tongue  Eyes: Conjunctivae and EOM are normal. Pupils are equal, round, and reactive to light.  Neck: Neck supple.  Cardiovascular: Normal rate, regular rhythm and normal heart sounds.  No murmur heard. Pulmonary/Chest: Effort normal and breath sounds normal. No respiratory distress.  Abdominal: Soft. Bowel sounds are normal. He exhibits no distension. There is no tenderness.  Musculoskeletal: He exhibits no edema.  Neurological: He is alert and oriented to person, place, and time. He has normal reflexes. No cranial nerve deficit. He exhibits normal muscle tone.  Fluent speech, normal finger-to-nose testing, negative pronator drift, no clonus 5/5 strength and normal sensation x all 4 extremities  Skin: Skin is warm and dry.  Psychiatric: He has a normal mood and affect. Judgment  and thought content normal.  Nursing note and vitals reviewed.    ED Treatments / Results  Labs (all labs ordered are listed, but only abnormal results are displayed) Labs Reviewed - No data to display  EKG  EKG Interpretation None       Radiology No results found.  Procedures Procedures (including critical care time)  Medications Ordered in ED Medications - No data to display   Initial Impression / Assessment and Plan / ED Course  I have reviewed the triage vital signs and the nursing notes.      PT presented w/ mother to ED after seizure activity  at home, known brain mass and missed dose of meds today.  His neurologic exam was normal and vital signs reassuring.  He requested to leave without workup.  I explained that based on his known brain mass I would recommend further evaluation including head CT to rule out change in mass size or new edema or hemorrhage.  I discussed risks of leaving including life-threatening condition, permanent disability, or even death.  He voiced understanding but wanted to go home and take his medication.  I strongly recommended contacting his outpatient provider in the morning for close follow-up and extensively reviewed return precautions.  Patient signed out [place].  Final Clinical Impressions(s) / ED Diagnoses   Final diagnoses:  Seizure [facility])  Brain tumor [facility])    ED Discharge Orders    None       Itzabella Sorrels, Wenda Overland, MD 01/22/17 1805

## 2017-01-23 ENCOUNTER — Telehealth: Payer: Self-pay | Admitting: *Deleted

## 2017-01-23 NOTE — Telephone Encounter (Signed)
Patients mother Orell Hurtado called to advise that patient was brought to ED this past weekend due to seizure.  Patient had missed a dose of his Keppra that day.  Attempted to return call to mother, vm left, no answer.  No current changes in Keppra dosing, need to reinforce importance of taking medications consistently and will keep next MRI as scheduled per Dr. Mickeal Skinner.

## 2017-01-31 ENCOUNTER — Telehealth: Payer: Self-pay | Admitting: *Deleted

## 2017-01-31 ENCOUNTER — Inpatient Hospital Stay: Payer: Medicaid Other | Attending: Internal Medicine | Admitting: Internal Medicine

## 2017-01-31 ENCOUNTER — Other Ambulatory Visit: Payer: Self-pay

## 2017-01-31 VITALS — BP 112/67 | HR 98 | Temp 99.4°F | Resp 18 | Ht 69.0 in | Wt 105.9 lb

## 2017-01-31 DIAGNOSIS — G9389 Other specified disorders of brain: Secondary | ICD-10-CM

## 2017-01-31 DIAGNOSIS — G939 Disorder of brain, unspecified: Secondary | ICD-10-CM | POA: Insufficient documentation

## 2017-01-31 DIAGNOSIS — R569 Unspecified convulsions: Secondary | ICD-10-CM | POA: Diagnosis not present

## 2017-01-31 MED ORDER — LAMOTRIGINE 25 MG PO TABS
25.0000 mg | ORAL_TABLET | Freq: Every day | ORAL | 0 refills | Status: DC
Start: 2017-01-31 — End: 2017-03-07

## 2017-01-31 MED ORDER — LAMOTRIGINE 25 MG PO TABS: 50.0000 mg | ORAL_TABLET | Freq: Every day | ORAL | 0 refills | Status: DC

## 2017-01-31 NOTE — Telephone Encounter (Signed)
Mother Oris Drone called to advise that patient had had another seizure.  During this episode he went unconscious and vomited.  Following when he came to he complained of a bad headache.    Per Dr. Mickeal Skinner patient need to be seen.  Appointment scheduled for today.  Mother aware and will notify patient.

## 2017-01-31 NOTE — Progress Notes (Signed)
[facility] at [place], [place] [address] [phone]   Interval Evaluation  Date of Service: 01/31/17 Patient Name: Marco Scott Patient MRN: [id number] Patient DOB: 12-18-1995 Provider: Ventura Sellers, MD  Identifying Statement:  Marco Scott is a 22 y.o. male with left frontal brain mass   Interval History:  Marco Scott presents today for an evaluation due to recent increase in frequency of seizures.  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  He had missed one dose of his Keppra.  This past week, he had an additional event, this time while at work without any medication compliance issue or provocation.  His post-ictal behavior, which he doesn't remember per usual, was very frightening to his co-workers.  He feels ok today, and otherwise denies new or progressive neurologic deficits.  His mother does acknowledge a change in behavior since the seizures/keppra started, she describes him as more irritable and aggressive in general.   Medications: Current Outpatient Medications on File Prior to Visit  Medication Sig Dispense Refill  . levETIRAcetam (KEPPRA) 500 MG tablet Take 1 tablet (500 mg total) by mouth 2 (two) times daily. 180 tablet 0  . ibuprofen (ADVIL,MOTRIN) 200 MG tablet Take 600 mg by mouth every 6 (six) hours as needed for headache.     No current facility-administered medications on file prior to visit.     Allergies: No Known Allergies Past Medical History:  Past Medical History:  Diagnosis Date  . Seizure ([place])    FOR LAST 4 MONTHS   Past Surgical History: No past surgical history on file. Social History:  Social History   Socioeconomic History  . Marital status: Single    Spouse name: Not on file  . Number of children: Not on file  . Years of education: Not on file  . Highest education  level: Not on file  Social Needs  . Financial resource strain: Somewhat hard  . Food insecurity - worry: Patient refused  . Food insecurity - inability: Patient refused  . Transportation needs - medical: Patient refused  . Transportation needs - non-medical: Patient refused  Occupational History  . Not on file  Tobacco Use  . Smoking status: Current Every Day Smoker    Types: Cigarettes  . Smokeless tobacco: Never Used  Substance and Sexual Activity  . Alcohol use: No    Frequency: Never  . Drug use: No  . Sexual activity: Not Currently  Other Topics Concern  . Not on file  Social History Narrative   Currently on house arrest and wearing ankle monitor   Family History: No family history on file.  Review of Systems: Constitutional: Denies fevers, chills or abnormal weight loss Eyes: Denies blurriness of vision Ears, nose, mouth, throat, and face: Denies mucositis or sore throat Respiratory: Denies cough, dyspnea or wheezes Cardiovascular: Denies palpitation, chest discomfort or lower extremity swelling Gastrointestinal:  Denies nausea, constipation, diarrhea GU: Denies dysuria or incontinence Skin: Denies abnormal skin rashes Neurological: Per HPI Musculoskeletal: Denies joint pain, back or neck discomfort. No decrease in ROM Behavioral/Psych: Denies anxiety, disturbance in thought content, and mood instability  Physical Exam: Vitals:   01/31/17 1203  BP: 112/67  Pulse: 98  Resp: 18  Temp: 99.4 F (37.4 C)  SpO2: 98%   KPS: 100. General: Alert, cooperative, pleasant, in no acute distress Head: Normal EENT: No conjunctival injection  or scleral icterus. Oral mucosa moist Lungs: Resp effort normal Cardiac: Regular rate and rhythm Abdomen: Soft, non-distended abdomen Skin: No rashes cyanosis or petechiae. Extremities: No clubbing or edema  Neurologic Exam: Mental Status: Awake, alert, attentive to examiner. Oriented to self and environment. Language is fluent  with intact comprehension.  Cranial Nerves: Visual acuity is grossly normal. Visual fields are full. Extra-ocular movements intact. No ptosis. Face is symmetric, tongue midline. Motor: Tone and bulk are normal. Power is full in both arms and legs. Reflexes are symmetric, no pathologic reflexes present. Intact finger to nose bilaterally Sensory: Intact to light touch and temperature Gait: Normal and tandem gait is normal.   Labs: I have reviewed the data as listed    Component Value Date/Time   NA 137 12/18/2016 0316   K 3.6 12/18/2016 0316   CL 112 (H) 12/18/2016 0316   CO2 20 (L) 12/18/2016 0316   GLUCOSE 79 12/18/2016 0316   BUN 12 12/18/2016 0316   CREATININE 0.95 12/18/2016 0316   CALCIUM 8.6 (L) 12/18/2016 0316   PROT 6.8 12/17/2016 1552   ALBUMIN 3.8 12/17/2016 1552   AST 26 12/17/2016 1552   ALT 12 (L) 12/17/2016 1552   ALKPHOS 59 12/17/2016 1552   BILITOT 0.7 12/17/2016 1552   GFRNONAA >60 12/18/2016 0316   GFRAA >60 12/18/2016 0316   Lab Results  Component Value Date   WBC 5.8 12/18/2016   NEUTROABS 4.9 12/17/2016   HGB 11.6 (L) 12/18/2016   HCT 34.5 (L) 12/18/2016   MCV 94.3 12/18/2016   PLT 245 12/18/2016     Assessment/Plan 1. Brain mass 2. Seizure [facility])  We appreciate the opportunity to participate in the care of [facility].    Because of his mood lability issues with frontal tumor and post-ictal aggression, Keppra is likely not the best choice long term.  We would prefer a mood stabilizing AED.    We recommended adding Lamictal to his AED regimen.  This should be started at 25mg  daily for the first two weeks, then 50mg  daily for two weeks.  At that time he will be due to see me again in clinic to review his next MRI.  We will likely continue to slowly increase Lamictal up to a goal of ~300mg  daily.    We counseled him to reach to [place] immediately with any appearance of new rash.  In the meantime he should continue Keppra at current dose, until  Lamictal is fully titrated.  All questions were answered. The patient knows to call the clinic with any problems, questions or concerns. No barriers to learning were detected.  The total time spent in the encounter was 40 minutes and more than 50% was on counseling and review of test results   Ventura Sellers, MD Medical Director of Neuro-Oncology [facility] at [place] 01/31/17 12:12 PM

## 2017-02-23 ENCOUNTER — Ambulatory Visit (HOSPITAL_COMMUNITY)
Admission: RE | Admit: 2017-02-23 | Discharge: 2017-02-23 | Disposition: A | Discharge status: Home / Self Care | Payer: Medicaid Other | Source: Ambulatory Visit | Attending: Internal Medicine | Admitting: Internal Medicine

## 2017-02-23 DIAGNOSIS — D496 Neoplasm of unspecified behavior of brain: Secondary | ICD-10-CM | POA: Insufficient documentation

## 2017-02-23 MED ORDER — GADOBENATE DIMEGLUMINE 529 MG/ML IV SOLN
15.0000 mL | Freq: Once | INTRAVENOUS | Status: AC | PRN
  Administered 2017-02-23: 9 mL via INTRAVENOUS

## 2017-02-28 ENCOUNTER — Encounter: Payer: Self-pay | Admitting: Internal Medicine

## 2017-02-28 ENCOUNTER — Telehealth: Payer: Self-pay

## 2017-02-28 ENCOUNTER — Inpatient Hospital Stay: Payer: Medicaid Other | Attending: Internal Medicine | Admitting: Internal Medicine

## 2017-02-28 VITALS — BP 100/66 | HR 71 | Temp 98.6°F | Resp 17

## 2017-02-28 DIAGNOSIS — R569 Unspecified convulsions: Secondary | ICD-10-CM

## 2017-02-28 DIAGNOSIS — G9389 Other specified disorders of brain: Secondary | ICD-10-CM

## 2017-02-28 DIAGNOSIS — D496 Neoplasm of unspecified behavior of brain: Secondary | ICD-10-CM | POA: Diagnosis not present

## 2017-02-28 DIAGNOSIS — F17210 Nicotine dependence, cigarettes, uncomplicated: Secondary | ICD-10-CM | POA: Diagnosis not present

## 2017-02-28 DIAGNOSIS — Z79899 Other long term (current) drug therapy: Secondary | ICD-10-CM | POA: Diagnosis not present

## 2017-02-28 NOTE — Telephone Encounter (Signed)
Per 2/21 NO LOS.

## 2017-02-28 NOTE — Progress Notes (Signed)
Marco Scott at [facility] [place], [place] [address] [phone]   Interval Evaluation  Date of Service: 02/28/17 Patient Name: Marco Scott Patient MRN: [id number] Patient DOB: 05/26/1995 Provider: Ventura Sellers, MD  Identifying Statement:  Marco Scott is a 22 y.o. male with left frontal brain mass   Interval History:  Marco Scott presents today for follow up after recent MRI.  He denies any seizures since our previous visit, he has been compliant with both Keppra and Lamictal.  He does acknowledge some stabilizing of his mood swings since starting Lamictal, although he is somewhat anxious about the prospect of surgery.  He denies headaches, new or progressive neurologic deficits.  Medications: Current Outpatient Medications on File Prior to Visit  Medication Sig Dispense Refill  . ibuprofen (ADVIL,MOTRIN) 200 MG tablet Take 600 mg by mouth every 6 (six) hours as needed for headache.    . lamoTRIgine (LAMICTAL) 25 MG tablet Take 1 tablet (25 mg total) by mouth daily for 14 days. 14 tablet 0  . lamoTRIgine (LAMICTAL) 25 MG tablet Take 2 tablets (50 mg total) by mouth daily for 14 days. 28 tablet 0  . levETIRAcetam (KEPPRA) 500 MG tablet Take 1 tablet (500 mg total) by mouth 2 (two) times daily. 180 tablet 0   No current facility-administered medications on file prior to visit.     Allergies:  Allergies  Allergen Reactions  . Gadolinium Derivatives Nausea And Vomiting   Past Medical History:  Past Medical History:  Diagnosis Date  . Seizure ([place])    FOR LAST 4 MONTHS   Past Surgical History: No past surgical history on file. Social History:  Social History   Socioeconomic History  . Marital status: Single    Spouse name: Not on file  . Number of children: Not on file  . Years of education: Not on file  . Highest education level: Not on file  Social Needs  . Financial resource strain: Somewhat hard  . Food insecurity -  worry: Patient refused  . Food insecurity - inability: Patient refused  . Transportation needs - medical: Patient refused  . Transportation needs - non-medical: Patient refused  Occupational History  . Not on file  Tobacco Use  . Smoking status: Current Every Day Smoker    Types: Cigarettes  . Smokeless tobacco: Never Used  Substance and Sexual Activity  . Alcohol use: No    Frequency: Never  . Drug use: No  . Sexual activity: Not Currently  Other Topics Concern  . Not on file  Social History Narrative   Currently on house arrest and wearing ankle monitor   Family History: No family history on file.  Review of Systems: Constitutional: Denies fevers, chills or abnormal weight loss Eyes: Denies blurriness of vision Ears, nose, mouth, throat, and face: Denies mucositis or sore throat Respiratory: Denies cough, dyspnea or wheezes Cardiovascular: Denies palpitation, chest discomfort or lower extremity swelling Gastrointestinal:  Denies nausea, constipation, diarrhea GU: Denies dysuria or incontinence Skin: Denies abnormal skin rashes Neurological: Per HPI Musculoskeletal: Denies joint pain, back or neck discomfort. No decrease in ROM Behavioral/Psych: Denies anxiety, disturbance in thought content, and mood instability  Physical Exam: Vitals:   02/28/17 1332  BP: 100/66  Pulse: 71  Resp: 17  Temp: 98.6 F (37 C)  SpO2: 100%   KPS: 100. General: Alert, cooperative, pleasant, in no acute distress Head: Normal EENT: No conjunctival injection or scleral icterus. Oral mucosa moist Lungs:  Resp effort normal Cardiac: Regular rate and rhythm Abdomen: Soft, non-distended abdomen Skin: No rashes cyanosis or petechiae. Extremities: No clubbing or edema  Neurologic Exam: Mental Status: Awake, alert, attentive to examiner. Oriented to self and environment. Language is fluent with intact comprehension.  Cranial Nerves: Visual acuity is grossly normal. Visual fields are full.  Extra-ocular movements intact. No ptosis. Face is symmetric, tongue midline. Motor: Tone and bulk are normal. Power is full in both arms and legs. Reflexes are symmetric, no pathologic reflexes present. Intact finger to nose bilaterally Sensory: Intact to light touch and temperature Gait: Normal and tandem gait is normal.   Labs: I have reviewed the data as listed    Component Value Date/Time   NA 137 12/18/2016 0316   K 3.6 12/18/2016 0316   CL 112 (H) 12/18/2016 0316   CO2 20 (L) 12/18/2016 0316   GLUCOSE 79 12/18/2016 0316   BUN 12 12/18/2016 0316   CREATININE 0.95 12/18/2016 0316   CALCIUM 8.6 (L) 12/18/2016 0316   PROT 6.8 12/17/2016 1552   ALBUMIN 3.8 12/17/2016 1552   AST 26 12/17/2016 1552   ALT 12 (L) 12/17/2016 1552   ALKPHOS 59 12/17/2016 1552   BILITOT 0.7 12/17/2016 1552   GFRNONAA >60 12/18/2016 0316   GFRAA >60 12/18/2016 0316   Lab Results  Component Value Date   WBC 5.8 12/18/2016   NEUTROABS 4.9 12/17/2016   HGB 11.6 (L) 12/18/2016   HCT 34.5 (L) 12/18/2016   MCV 94.3 12/18/2016   PLT 245 12/18/2016   Imaging:  [facility] Clinician Interpretation: I have personally reviewed the CNS images as listed.  My interpretation, in the context of the patient's clinical presentation, is stable disease  Mr Marco Scott Wo Contrast  Result Date: 02/23/2017 CLINICAL DATA:  Follow up of brain abnormality seen on prior imaging. Possible tumor or cyst. EXAM: MRI HEAD WITHOUT AND WITH CONTRAST TECHNIQUE: Multiplanar, multiecho pulse sequences of the brain and surrounding structures were obtained without and with intravenous contrast. CONTRAST:  4mL MULTIHANCE GADOBENATE DIMEGLUMINE 529 MG/ML IV SOLN COMPARISON:  Brain MRI 12/17/2016 FINDINGS: Brain: The midline structures are normal. There is no acute infarct or acute hemorrhage. Cortically based lesion at the anterior aspect of the left superior frontal gyrus is unchanged in size, measuring 16 x 13 mm. No associated contrast  enhancement or surrounding signal abnormality. No age-advanced or lobar predominant atrophy. No chronic microhemorrhage or superficial siderosis. Vascular: Major intracranial arterial and venous sinus flow voids are preserved. Skull and upper cervical spine: The visualized skull base, calvarium, upper cervical spine and extracranial soft tissues are normal. Sinuses/Orbits: No fluid levels or advanced mucosal thickening. No mastoid or middle ear effusion. Normal orbits. IMPRESSION: Unchanged size and appearance of left superior frontal gyrus cystic lesion. The appearance remains nonspecific with differential considerations including neuroepithelial cyst, ganglioglioma or, less likely, dysembryoplastic neuroepithelial tumor (DNET). Electronically Signed   By: Ulyses Jarred M.D.   On: 02/23/2017 15:33     Assessment/Plan 1. Brain mass 2. Seizure [facility])  We appreciate the opportunity to participate in the care of [facility].  He is clinically and radiographically stable today.  His case was reviewed this week in multi-disciplinary brain tumor board.  The recommendation at this time is to proceed with craniotomy and resection of the mass.  We discussed some of the risks and benefits of this approach today.  He will visit with Dr. Vertell Limber of neurosurgery later this week.  For AED, we recommend he increase Lamictal  to 50mg  BID for the next two weeks, then can increase to 100mg  BID for two weeks.  At that time, if surgery is complete, we will discuss weaning Keppra.  In the meantime he should continue Keppra at current dose, until Lamictal is fully titrated.  All questions were answered. The patient knows to call the clinic with any problems, questions or concerns. No barriers to learning were detected.  The total time spent in the encounter was 40 minutes and more than 50% was on counseling and review of test results   Ventura Sellers, MD Medical Director of Neuro-Oncology [facility] at [place] 02/28/17 12:42 PM

## 2017-03-01 ENCOUNTER — Other Ambulatory Visit: Payer: Self-pay | Admitting: Neurosurgery

## 2017-03-01 ENCOUNTER — Other Ambulatory Visit (HOSPITAL_COMMUNITY): Payer: Self-pay | Admitting: Neurosurgery

## 2017-03-01 DIAGNOSIS — D496 Neoplasm of unspecified behavior of brain: Secondary | ICD-10-CM

## 2017-03-07 ENCOUNTER — Other Ambulatory Visit: Payer: Self-pay | Admitting: *Deleted

## 2017-03-14 NOTE — H&P (Signed)
Patient ID:   [id number] Patient: Marco Scott  Date of Birth: [birth year] Visit Type: Office Visit   Date: 03/01/2017 12:30 PM Provider: Marchia Meiers. Vertell Limber MD   This 22 year old male presents for brain tumor.   History of Present Illness: 1.  brain tumor  Marco Scott, 22 year old male, visits for evaluation, accompanied by his mother.  First seizure experienced July 2018, 2nd seizure and subsequent diagnosis brain tumor made December 2018.  Patient reports no medical history, healthy, other than occasional marijuana use.  Surgical history:  Right axilla cyst excision August 2018.    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  He has bitten his tongue and he is unconscious after these occur.  He has no recollection of the seizures his mother has witnessed 1 of the after maths of a seizure and was present for this consultation.  He denies headaches or other neurologic problems           PAST MEDICAL/SURGICAL HISTORY   (Detailed)     PAST MEDICAL HISTORY, SURGICAL HISTORY, FAMILY HISTORY, SOCIAL HISTORY AND REVIEW OF SYSTEMS I have reviewed the patient's past medical, surgical, family and social history as well as the comprehensive review of systems as included on the [facility] history form dated 03/01/2017, which I have signed.  Family History  (Detailed)   Social History:  (Detailed) Tobacco use reviewed. Preferred language is [place].   Tobacco use status: Current non-smoker. Smoking status: Never smoker.  SMOKING STATUS Type Smoking Status Usage Per Day Years Used Total Pack Years   Never smoker          MEDICATIONS(added, continued or stopped this visit): Started Medication Directions Instruction Stopped   Keppra 500 mg tablet take 1 tablet by oral route 2 times  every day       ALLERGIES:   Review of Systems System Neg/Pos Details  Constitutional Negative Chills, Fatigue, Fever, Malaise, Night sweats, Weight gain and Weight loss.  ENMT Negative Ear drainage, Hearing loss, Nasal drainage, Otalgia, Sinus pressure and Sore throat.  Eyes Negative Eye discharge, Eye pain and Vision changes.  Respiratory Negative Chronic cough, Cough, Dyspnea, Known TB exposure and Wheezing.  Cardio Negative Chest pain, Claudication, Edema and Irregular heartbeat/palpitations.  GI Negative Abdominal pain, Blood in stool, Change in stool pattern, Constipation, Decreased appetite, Diarrhea, Heartburn, Nausea and Vomiting.  GU Negative Dribbling, Dysuria, Erectile dysfunction, Hematuria, Polyuria (Genitourinary), Slow stream, Urinary frequency, Urinary incontinence and Urinary retention.  Endocrine Negative Cold intolerance, Heat intolerance, Polydipsia and Polyphagia.  Neuro Negative Dizziness, Extremity weakness, Gait disturbance, Headache, Memory impairment, Numbness in extremity, Seizures and Tremors.  Psych Negative Anxiety, Depression and Insomnia.  Integumentary Negative Brittle hair, Brittle nails, Change in shape/size of mole(s), Hair loss, Hirsutism, Hives, Pruritus, Rash and Skin lesion.  MS Negative Back pain, Joint pain, Joint swelling, Muscle weakness and Neck pain.  Hema/Lymph Negative Easy bleeding, Easy bruising and Lymphadenopathy.  Allergic/Immuno Negative Contact allergy, Environmental allergies, Food allergies and Seasonal allergies.  Reproductive Negative Penile discharge and Sexual dysfunction.   Vitals Date Temp F BP Pulse Ht In Wt Lb BMI BSA Pain Score  03/01/2017  105/70 66 68 104.8 15.93  0/10     PHYSICAL EXAM General Level of Distress: no acute distress Overall Appearance: normal  Head and Face  Right Left  Fundoscopic Exam:  normal normal    Cardiovascular Cardiac: regular rate and rhythm without murmur  Right Left  Carotid  Pulses: normal normal  Respiratory Lungs: clear to auscultation  Neurological Orientation: normal Recent and Remote Memory: normal Attention Span and Concentration:   normal Language: normal Fund of Knowledge: normal  Right Left Sensation: normal normal Upper Extremity Coordination: normal normal  Lower Extremity Coordination: normal normal  Musculoskeletal Gait and Station: normal  Right Left Upper Extremity Muscle Strength: normal normal Lower Extremity Muscle Strength: normal normal Upper Extremity Muscle Tone:  normal normal Lower Extremity Muscle Tone: normal normal   Motor Strength Upper and lower extremity motor strength was tested in the clinically pertinent muscles.     Deep Tendon Reflexes  Right Left Biceps: normal normal Triceps: normal normal Brachioradialis: normal normal Patellar: normal normal Achilles: normal normal  Cranial Nerves II. Optic Nerve/Visual Fields: normal III. Oculomotor: normal IV. Trochlear: normal V. Trigeminal: normal VI. Abducens: normal VII. Facial: normal VIII. Acoustic/Vestibular: normal IX. Glossopharyngeal: normal X. Vagus: normal XI. Spinal Accessory: normal XII. 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  I have recommended proceeding with left frontal craniotomy for tumor using BrainLAB with preoperative brain imaging   Completed Orders (this encounter) Order Details Reason Side Interpretation Result Initial Treatment Date Region  Dietary management education, guidance, and counseling patient encouraged to eat a well balanced diet         Assessment/Plan # Detail Type Description   1. 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       2. Assessment Seizures  (R56.9).       3. Assessment Body mass index (BMI) 19.9 or less, adult (Z68.1).   Plan Orders Today's instructions / counseling include(s) Dietary management education, guidance, and counseling.           Pain Management Plan Pain Scale: 0/10. Method: Numeric Pain Intensity Scale.  The patient wishes to proceed with surgery.  He is aware of the risks and benefits.  He was initially upset about having to have his hair cut but he is willing to go along with this.   Orders: Diagnostic Procedures: Assessment Procedure  D49.6 MRI Brain With & W/o Contrast BrainLAB protocol for surgical planning  Instruction(s)/Education: Assessment Instruction  Z68.1 Dietary management education, guidance, and counseling             Provider:  Vertell Limber MD, Marchia Meiers 03/04/2017 4:36 PM  Dictation edited by: Marchia Meiers. Vertell Limber    CC Providers: Erline Levine MD  [address] [place], [place] [address]              Electronically signed by Marchia Meiers. Vertell Limber MD on 03/04/2017 04:36 PM

## 2017-03-14 NOTE — Pre-Procedure Instructions (Signed)
Marco Scott  03/14/2017      [facility], [address] [place] [place] [address] Phone: [phone] Fax: [phone]    Your procedure is scheduled on 03/21/2017.  Report to [facility] Admitting at [place].M.  Call this number if you have problems the morning of surgery:  [phone]   Remember:  Do not eat food or drink liquids after midnight.  Take these medicines the morning of surgery with A SIP OF WATER: Lamotrigine (Lamictal) Levetiracetam (Keppra)  7 days prior to surgery STOP taking any Aspirin(unless otherwise instructed by your surgeon), Aleve, Naproxen, Ibuprofen, Motrin, Advil, Goody's, BC's, all herbal medications, fish oil, and all vitamins    Do not wear jewelry  Do not wear lotions, powders, or colognes, or deodorant.  Men may shave face and neck.  Do not bring valuables to the hospital.  [facility] is not responsible for any belongings or valuables.  Contacts, dentures or bridgework may not be worn into surgery.  Leave your suitcase in the car.  After surgery it may be brought to your room.  For patients admitted to the hospital, discharge time will be determined by your treatment team.  Patients discharged the day of surgery will not be allowed to drive home.   Name and phone number of your driver:    Special instructions:   [place]- Preparing For Surgery  Before surgery, you can play an important role. Because skin is not sterile, your skin needs to be as free of germs as possible. You can reduce the number of germs on your skin by washing with CHG (chlorahexidine gluconate) Soap before surgery.  CHG is an antiseptic cleaner which kills germs and bonds with the skin to continue killing germs even after washing.  Please do not use if you have an allergy to CHG or antibacterial soaps. If your skin becomes reddened/irritated stop using the CHG.  Do not shave (including legs and  underarms) for at least 48 hours prior to first CHG shower. It is OK to shave your face.  Please follow these instructions carefully.   1. Shower the NIGHT BEFORE SURGERY and the MORNING OF SURGERY with CHG.   2. If you chose to wash your hair, wash your hair first as usual with your normal shampoo.  3. After you shampoo, rinse your hair and body thoroughly to remove the shampoo.  4. Use CHG as you would any other liquid soap. You can apply CHG directly to the skin and wash gently with a scrungie or a clean washcloth.   5. Apply the CHG Soap to your body ONLY FROM THE NECK DOWN.  Do not use on open wounds or open sores. Avoid contact with your eyes, ears, mouth and genitals (private parts). Wash Face and genitals (private parts)  with your normal soap.  6. Wash thoroughly, paying special attention to the area where your surgery will be performed.  7. Thoroughly rinse your body with warm water from the neck down.  8. DO NOT shower/wash with your normal soap after using and rinsing off the CHG Soap.  9. Pat yourself dry with a CLEAN TOWEL.  10. Wear CLEAN PAJAMAS to bed the night before surgery, wear comfortable clothes the morning of surgery  11. Place CLEAN SHEETS on your bed the night of your first shower and DO NOT SLEEP WITH PETS.    Day of Surgery: Shower as stated above. Do  not apply any deodorants/lotions.  Please wear clean clothes to the hospital/surgery center.      Please read over the following fact sheets that you were given.

## 2017-03-15 ENCOUNTER — Encounter (HOSPITAL_COMMUNITY): Payer: Self-pay

## 2017-03-15 ENCOUNTER — Encounter (HOSPITAL_COMMUNITY)
Admission: RE | Admit: 2017-03-15 | Discharge: 2017-03-15 | Disposition: A | Discharge status: Home / Self Care | Payer: Medicaid Other | Source: Ambulatory Visit | Attending: Neurosurgery | Admitting: Neurosurgery

## 2017-03-15 ENCOUNTER — Other Ambulatory Visit: Payer: Self-pay

## 2017-03-15 ENCOUNTER — Ambulatory Visit (HOSPITAL_COMMUNITY)
Admission: RE | Admit: 2017-03-15 | Discharge: 2017-03-15 | Disposition: A | Discharge status: Home / Self Care | Payer: Medicaid Other | Source: Ambulatory Visit | Attending: Neurosurgery | Admitting: Neurosurgery

## 2017-03-15 DIAGNOSIS — D496 Neoplasm of unspecified behavior of brain: Secondary | ICD-10-CM | POA: Diagnosis present

## 2017-03-15 LAB — BASIC METABOLIC PANEL
ANION GAP: 10 (ref 5–15)
BUN: 10 mg/dL (ref 6–20)
CHLORIDE: 103 mmol/L (ref 101–111)
CO2: 23 mmol/L (ref 22–32)
CREATININE: 0.92 mg/dL (ref 0.61–1.24)
Calcium: 9.5 mg/dL (ref 8.9–10.3)
GFR calc non Af Amer: >60 mL/min (ref >60)
Glucose, Bld: 78 mg/dL (ref 65–99)
Potassium: 3.7 mmol/L (ref 3.5–5.1)
Sodium: 136 mmol/L (ref 135–145)

## 2017-03-15 LAB — CBC
HCT: 40.2 % (ref 39.0–52.0)
Hemoglobin: 13.7 g/dL (ref 13.0–17.0)
MCH: 32.5 pg (ref 26.0–34.0)
MCHC: 34.1 g/dL (ref 30.0–36.0)
MCV: 95.5 fL (ref 78.0–100.0)
PLATELETS: 255 10*3/uL (ref 150–400)
RBC: 4.21 MIL/uL — AB (ref 4.22–5.81)
RDW: 12.1 % (ref 11.5–15.5)
WBC: 8 10*3/uL (ref 4.0–10.5)

## 2017-03-15 LAB — TYPE AND SCREEN
ABO/RH(D): B POS
Antibody Screen: NEGATIVE

## 2017-03-15 LAB — ABO/RH: ABO/RH(D): B POS

## 2017-03-15 MED ORDER — GADOBENATE DIMEGLUMINE 529 MG/ML IV SOLN
10.0000 mL | Freq: Once | INTRAVENOUS | Status: AC | PRN
  Administered 2017-03-15: 10 mL via INTRAVENOUS

## 2017-03-15 NOTE — Progress Notes (Signed)
PCP - patient does not have one established Cardiologist - n/a Oncologist - Dr. Mickeal Skinner  Chest x-ray - n/a  EKG - 12/20/2016 Stress Test - patient denies ECHO - patient denies Cardiac Cath - patient denies  Anesthesia review: n/a  Patient denies shortness of breath, fever, cough and chest pain at PAT appointment.   Patient expressed concerns of homelessness and his social situation during PAT appointment.  Order for Social Work consult placed and to be released on the day of surgery.   Patient verbalized understanding of instructions that were given to them at the PAT appointment. Patient was also instructed that they will need to review over the PAT instructions again at home before surgery.

## 2017-03-21 ENCOUNTER — Encounter (HOSPITAL_COMMUNITY)
Admission: RE | Disposition: A | Discharge status: Home / Self Care | Payer: Self-pay | Source: Ambulatory Visit | Attending: Neurosurgery

## 2017-03-21 ENCOUNTER — Encounter (HOSPITAL_COMMUNITY): Payer: Self-pay

## 2017-03-21 ENCOUNTER — Inpatient Hospital Stay (HOSPITAL_COMMUNITY): Payer: Medicaid Other | Admitting: Certified Registered"

## 2017-03-21 ENCOUNTER — Inpatient Hospital Stay (HOSPITAL_COMMUNITY)
Admission: RE | Admit: 2017-03-21 | Discharge: 2017-03-22 | DRG: 027 | Disposition: A | Discharge status: Home / Self Care | Payer: Medicaid Other | Source: Ambulatory Visit | Attending: Neurosurgery | Admitting: Neurosurgery

## 2017-03-21 ENCOUNTER — Other Ambulatory Visit: Payer: Self-pay | Admitting: Family Medicine

## 2017-03-21 ENCOUNTER — Inpatient Hospital Stay (HOSPITAL_COMMUNITY): Payer: Medicaid Other

## 2017-03-21 DIAGNOSIS — Z452 Encounter for adjustment and management of vascular access device: Secondary | ICD-10-CM

## 2017-03-21 DIAGNOSIS — R569 Unspecified convulsions: Secondary | ICD-10-CM | POA: Diagnosis present

## 2017-03-21 DIAGNOSIS — F17210 Nicotine dependence, cigarettes, uncomplicated: Secondary | ICD-10-CM | POA: Diagnosis present

## 2017-03-21 DIAGNOSIS — Z888 Allergy status to other drugs, medicaments and biological substances status: Secondary | ICD-10-CM | POA: Diagnosis not present

## 2017-03-21 DIAGNOSIS — D496 Neoplasm of unspecified behavior of brain: Secondary | ICD-10-CM | POA: Diagnosis present

## 2017-03-21 DIAGNOSIS — Z79899 Other long term (current) drug therapy: Secondary | ICD-10-CM | POA: Diagnosis not present

## 2017-03-21 HISTORY — DX: Neoplasm of unspecified behavior of brain: D49.6

## 2017-03-21 HISTORY — PX: APPLICATION OF CRANIAL NAVIGATION: SHX6578

## 2017-03-21 HISTORY — PX: CRANIOTOMY: SHX93

## 2017-03-21 LAB — MRSA PCR SCREENING: MRSA by PCR: NEGATIVE

## 2017-03-21 SURGERY — CRANIOTOMY TUMOR EXCISION
Anesthesia: General | Site: Head

## 2017-03-21 MED ORDER — ONDANSETRON HCL 4 MG/2ML IJ SOLN: 4.0000 mg | Freq: Once | INTRAMUSCULAR | Status: DC | PRN

## 2017-03-21 MED ORDER — ONDANSETRON HCL 4 MG/2ML IJ SOLN
INTRAMUSCULAR | Status: AC
  Filled 2017-03-21: qty 2

## 2017-03-21 MED ORDER — PANTOPRAZOLE SODIUM 40 MG IV SOLR
40.0000 mg | Freq: Every day | INTRAVENOUS | Status: DC
  Administered 2017-03-21: 40 mg via INTRAVENOUS
  Filled 2017-03-21: qty 40

## 2017-03-21 MED ORDER — VANCOMYCIN HCL 1000 MG IV SOLR
INTRAVENOUS | Status: AC
  Filled 2017-03-21: qty 1000

## 2017-03-21 MED ORDER — PROPOFOL 10 MG/ML IV BOLUS
INTRAVENOUS | Status: AC
  Filled 2017-03-21: qty 20

## 2017-03-21 MED ORDER — CEFAZOLIN SODIUM-DEXTROSE 1-4 GM/50ML-% IV SOLN
1.0000 g | Freq: Three times a day (TID) | INTRAVENOUS | Status: AC
  Administered 2017-03-21 – 2017-03-22 (×2): 1 g via INTRAVENOUS
  Filled 2017-03-21 (×2): qty 50

## 2017-03-21 MED ORDER — ROCURONIUM BROMIDE 50 MG/5ML IV SOLN
INTRAVENOUS | Status: AC
  Filled 2017-03-21: qty 2

## 2017-03-21 MED ORDER — SODIUM CHLORIDE 0.9 % IV SOLN
0.0125 ug/kg/min | INTRAVENOUS | Status: DC
  Filled 2017-03-21: qty 2000

## 2017-03-21 MED ORDER — LIDOCAINE 2% (20 MG/ML) 5 ML SYRINGE
INTRAMUSCULAR | Status: DC | PRN
  Administered 2017-03-21: 40 mg via INTRAVENOUS

## 2017-03-21 MED ORDER — PHENYLEPHRINE HCL 10 MG/ML IJ SOLN
INTRAVENOUS | Status: DC | PRN
  Administered 2017-03-21: 25 ug/min via INTRAVENOUS

## 2017-03-21 MED ORDER — DEXAMETHASONE SODIUM PHOSPHATE 10 MG/ML IJ SOLN
INTRAMUSCULAR | Status: DC | PRN
  Administered 2017-03-21: 10 mg via INTRAVENOUS

## 2017-03-21 MED ORDER — REMIFENTANIL HCL 1 MG IV SOLR
INTRAVENOUS | Status: DC | PRN
  Administered 2017-03-21: .5 ug/kg/min via INTRAVENOUS

## 2017-03-21 MED ORDER — FLEET ENEMA 7-19 GM/118ML RE ENEM: 1.0000 | ENEMA | Freq: Once | RECTAL | Status: DC | PRN

## 2017-03-21 MED ORDER — POTASSIUM CHLORIDE IN NACL 20-0.9 MEQ/L-% IV SOLN
INTRAVENOUS | Status: DC
  Administered 2017-03-21 – 2017-03-22 (×2): via INTRAVENOUS
  Filled 2017-03-21 (×2): qty 1000

## 2017-03-21 MED ORDER — CHLORHEXIDINE GLUCONATE CLOTH 2 % EX PADS: 6.0000 | MEDICATED_PAD | Freq: Once | CUTANEOUS | Status: DC

## 2017-03-21 MED ORDER — OXYCODONE HCL 5 MG/5ML PO SOLN: 5.0000 mg | Freq: Once | ORAL | Status: AC | PRN

## 2017-03-21 MED ORDER — BACITRACIN ZINC 500 UNIT/GM EX OINT
TOPICAL_OINTMENT | CUTANEOUS | Status: DC | PRN
  Administered 2017-03-21: 1 via TOPICAL

## 2017-03-21 MED ORDER — CEFAZOLIN SODIUM-DEXTROSE 2-4 GM/100ML-% IV SOLN
2.0000 g | INTRAVENOUS | Status: AC
  Administered 2017-03-21: 2 g via INTRAVENOUS
  Filled 2017-03-21: qty 100

## 2017-03-21 MED ORDER — SODIUM CHLORIDE 0.9 % IV SOLN
INTRAVENOUS | Status: DC | PRN
  Administered 2017-03-21: 07:00:00 via INTRAVENOUS

## 2017-03-21 MED ORDER — LEVETIRACETAM 500 MG/5ML IV SOLN
500.0000 mg | INTRAVENOUS | Status: AC
  Administered 2017-03-21: 500 mg via INTRAVENOUS
  Filled 2017-03-21: qty 5

## 2017-03-21 MED ORDER — OXYCODONE HCL 5 MG PO TABS
5.0000 mg | ORAL_TABLET | Freq: Once | ORAL | Status: AC | PRN
  Administered 2017-03-21: 5 mg via ORAL

## 2017-03-21 MED ORDER — PROPOFOL 10 MG/ML IV BOLUS
INTRAVENOUS | Status: DC | PRN
  Administered 2017-03-21: 150 mg via INTRAVENOUS
  Administered 2017-03-21: 80 mg via INTRAVENOUS

## 2017-03-21 MED ORDER — MICROFIBRILLAR COLL HEMOSTAT EX PADS
MEDICATED_PAD | CUTANEOUS | Status: DC | PRN
  Administered 2017-03-21: 1 via TOPICAL

## 2017-03-21 MED ORDER — MORPHINE SULFATE (PF) 4 MG/ML IV SOLN
1.0000 mg | INTRAVENOUS | Status: DC | PRN
  Administered 2017-03-21: 2 mg via INTRAVENOUS
  Administered 2017-03-21: 1 mg via INTRAVENOUS
  Administered 2017-03-21 – 2017-03-22 (×3): 2 mg via INTRAVENOUS
  Filled 2017-03-21 (×5): qty 1

## 2017-03-21 MED ORDER — THROMBIN (RECOMBINANT) 20000 UNITS EX SOLR
CUTANEOUS | Status: DC | PRN
  Administered 2017-03-21: 20 mL via TOPICAL

## 2017-03-21 MED ORDER — HYDROMORPHONE HCL 1 MG/ML IJ SOLN
0.2500 mg | INTRAMUSCULAR | Status: DC | PRN
  Administered 2017-03-21 (×4): 0.5 mg via INTRAVENOUS

## 2017-03-21 MED ORDER — LIDOCAINE 2% (20 MG/ML) 5 ML SYRINGE
INTRAMUSCULAR | Status: AC
  Filled 2017-03-21: qty 5

## 2017-03-21 MED ORDER — LEVETIRACETAM IN NACL 500 MG/100ML IV SOLN
500.0000 mg | Freq: Two times a day (BID) | INTRAVENOUS | Status: DC
  Administered 2017-03-21: 500 mg via INTRAVENOUS
  Filled 2017-03-21 (×2): qty 100

## 2017-03-21 MED ORDER — THROMBIN 5000 UNITS EX SOLR
CUTANEOUS | Status: AC
  Filled 2017-03-21: qty 5000

## 2017-03-21 MED ORDER — ROCURONIUM BROMIDE 10 MG/ML (PF) SYRINGE
PREFILLED_SYRINGE | INTRAVENOUS | Status: DC | PRN
  Administered 2017-03-21: 50 mg via INTRAVENOUS

## 2017-03-21 MED ORDER — THROMBIN 20000 UNITS EX SOLR
CUTANEOUS | Status: AC
  Filled 2017-03-21: qty 20000

## 2017-03-21 MED ORDER — THROMBIN (RECOMBINANT) 5000 UNITS EX SOLR
OROMUCOSAL | Status: DC | PRN
  Administered 2017-03-21: 5 mL via TOPICAL

## 2017-03-21 MED ORDER — ONDANSETRON HCL 4 MG/2ML IJ SOLN
4.0000 mg | INTRAMUSCULAR | Status: DC | PRN
  Administered 2017-03-21 – 2017-03-22 (×2): 4 mg via INTRAVENOUS
  Filled 2017-03-21 (×2): qty 2

## 2017-03-21 MED ORDER — BUPIVACAINE HCL (PF) 0.5 % IJ SOLN
INTRAMUSCULAR | Status: DC | PRN
  Administered 2017-03-21: 5 mL

## 2017-03-21 MED ORDER — MORPHINE SULFATE (PF) 4 MG/ML IV SOLN
INTRAVENOUS | Status: AC
  Filled 2017-03-21: qty 1

## 2017-03-21 MED ORDER — ACETAMINOPHEN 325 MG PO TABS
650.0000 mg | ORAL_TABLET | ORAL | Status: DC | PRN
  Administered 2017-03-22: 650 mg via ORAL
  Filled 2017-03-21: qty 2

## 2017-03-21 MED ORDER — MIDAZOLAM HCL 2 MG/2ML IJ SOLN
INTRAMUSCULAR | Status: AC
  Filled 2017-03-21: qty 2

## 2017-03-21 MED ORDER — LIDOCAINE-EPINEPHRINE 1 %-1:100000 IJ SOLN
INTRAMUSCULAR | Status: DC | PRN
  Administered 2017-03-21: 5 mL

## 2017-03-21 MED ORDER — HYDROMORPHONE HCL 1 MG/ML IJ SOLN
INTRAMUSCULAR | Status: AC
  Filled 2017-03-21: qty 1

## 2017-03-21 MED ORDER — MORPHINE SULFATE (PF) 2 MG/ML IV SOLN
1.0000 mg | INTRAVENOUS | Status: DC | PRN
  Administered 2017-03-21: 2 mg via INTRAVENOUS

## 2017-03-21 MED ORDER — OXYCODONE HCL 5 MG PO TABS
ORAL_TABLET | ORAL | Status: AC
  Filled 2017-03-21: qty 1

## 2017-03-21 MED ORDER — MIDAZOLAM HCL 5 MG/5ML IJ SOLN
INTRAMUSCULAR | Status: DC | PRN
  Administered 2017-03-21: 2 mg via INTRAVENOUS

## 2017-03-21 MED ORDER — LABETALOL HCL 5 MG/ML IV SOLN: 10.0000 mg | INTRAVENOUS | Status: DC | PRN

## 2017-03-21 MED ORDER — FENTANYL CITRATE (PF) 250 MCG/5ML IJ SOLN
INTRAMUSCULAR | Status: AC
  Filled 2017-03-21: qty 5

## 2017-03-21 MED ORDER — HYDROCODONE-ACETAMINOPHEN 5-325 MG PO TABS
1.0000 | ORAL_TABLET | ORAL | Status: DC | PRN
  Administered 2017-03-21 – 2017-03-22 (×5): 1 via ORAL
  Filled 2017-03-21 (×5): qty 1

## 2017-03-21 MED ORDER — PHENYLEPHRINE 40 MCG/ML (10ML) SYRINGE FOR IV PUSH (FOR BLOOD PRESSURE SUPPORT)
PREFILLED_SYRINGE | INTRAVENOUS | Status: DC | PRN
  Administered 2017-03-21 (×2): 80 ug via INTRAVENOUS

## 2017-03-21 MED ORDER — DEXAMETHASONE SODIUM PHOSPHATE 4 MG/ML IJ SOLN
4.0000 mg | Freq: Four times a day (QID) | INTRAMUSCULAR | Status: DC
  Administered 2017-03-22: 4 mg via INTRAVENOUS
  Filled 2017-03-21: qty 1

## 2017-03-21 MED ORDER — BISACODYL 10 MG RE SUPP: 10.0000 mg | Freq: Every day | RECTAL | Status: DC | PRN

## 2017-03-21 MED ORDER — SUGAMMADEX SODIUM 200 MG/2ML IV SOLN
INTRAVENOUS | Status: DC | PRN
  Administered 2017-03-21: 200 mg via INTRAVENOUS

## 2017-03-21 MED ORDER — POLYETHYLENE GLYCOL 3350 17 G PO PACK: 17.0000 g | PACK | Freq: Every day | ORAL | Status: DC | PRN

## 2017-03-21 MED ORDER — FENTANYL CITRATE (PF) 250 MCG/5ML IJ SOLN
INTRAMUSCULAR | Status: DC | PRN
  Administered 2017-03-21 (×2): 50 ug via INTRAVENOUS
  Administered 2017-03-21: 25 ug via INTRAVENOUS

## 2017-03-21 MED ORDER — SODIUM CHLORIDE 0.9 % IR SOLN
Status: DC | PRN
  Administered 2017-03-21 (×2): 1

## 2017-03-21 MED ORDER — LEVETIRACETAM 500 MG PO TABS: 500.0000 mg | ORAL_TABLET | Freq: Two times a day (BID) | ORAL | Status: DC

## 2017-03-21 MED ORDER — DEXAMETHASONE SODIUM PHOSPHATE 10 MG/ML IJ SOLN
INTRAMUSCULAR | Status: AC
  Filled 2017-03-21: qty 1

## 2017-03-21 MED ORDER — ACETAMINOPHEN 650 MG RE SUPP: 650.0000 mg | RECTAL | Status: DC | PRN

## 2017-03-21 MED ORDER — BACITRACIN ZINC 500 UNIT/GM EX OINT
TOPICAL_OINTMENT | CUTANEOUS | Status: AC
  Filled 2017-03-21: qty 28.35

## 2017-03-21 MED ORDER — DEXAMETHASONE SODIUM PHOSPHATE 10 MG/ML IJ SOLN
6.0000 mg | Freq: Four times a day (QID) | INTRAMUSCULAR | Status: AC
  Administered 2017-03-21 – 2017-03-22 (×4): 6 mg via INTRAVENOUS
  Filled 2017-03-21 (×4): qty 1

## 2017-03-21 MED ORDER — BUPIVACAINE HCL (PF) 0.5 % IJ SOLN
INTRAMUSCULAR | Status: AC
  Filled 2017-03-21: qty 30

## 2017-03-21 MED ORDER — ONDANSETRON HCL 4 MG/2ML IJ SOLN
INTRAMUSCULAR | Status: DC | PRN
  Administered 2017-03-21: 4 mg via INTRAVENOUS

## 2017-03-21 MED ORDER — SUGAMMADEX SODIUM 200 MG/2ML IV SOLN
INTRAVENOUS | Status: AC
  Filled 2017-03-21: qty 2

## 2017-03-21 MED ORDER — LIDOCAINE-EPINEPHRINE 1 %-1:100000 IJ SOLN
INTRAMUSCULAR | Status: AC
  Filled 2017-03-21: qty 1

## 2017-03-21 MED ORDER — PROMETHAZINE HCL 25 MG PO TABS
12.5000 mg | ORAL_TABLET | ORAL | Status: DC | PRN
  Administered 2017-03-21: 12.5 mg via ORAL
  Filled 2017-03-21: qty 1

## 2017-03-21 MED ORDER — ONDANSETRON HCL 4 MG PO TABS: 4.0000 mg | ORAL_TABLET | ORAL | Status: DC | PRN

## 2017-03-21 MED ORDER — DOCUSATE SODIUM 100 MG PO CAPS: 100.0000 mg | ORAL_CAPSULE | Freq: Two times a day (BID) | ORAL | Status: DC

## 2017-03-21 MED ORDER — DEXAMETHASONE SODIUM PHOSPHATE 4 MG/ML IJ SOLN: 4.0000 mg | Freq: Three times a day (TID) | INTRAMUSCULAR | Status: DC

## 2017-03-21 SURGICAL SUPPLY — 84 items
BANDAGE GAUZE 4  KLING STR (GAUZE/BANDAGES/DRESSINGS) IMPLANT
BATTERY IQ STERILE (MISCELLANEOUS) ×4 IMPLANT
BIT DRILL WIRE PASS 1.3MM (BIT) IMPLANT
BLADE CLIPPER SURG (BLADE) ×4 IMPLANT
BUR ACORN 6.0 PRECISION (BURR) ×3 IMPLANT
BUR ACORN 6.0MM PRECISION (BURR) ×1
BUR SPIRAL ROUTER 2.3 (BUR) ×3 IMPLANT
BUR SPIRAL ROUTER 2.3MM (BUR) ×1
CANISTER SUCT 3000ML PPV (MISCELLANEOUS) ×8 IMPLANT
CARTRIDGE OIL MAESTRO DRILL (MISCELLANEOUS) ×2 IMPLANT
CLIP VESOCCLUDE MED 6/CT (CLIP) IMPLANT
CONT SPEC 4OZ CLIKSEAL STRL BL (MISCELLANEOUS) ×4 IMPLANT
COVER MAYO STAND STRL (DRAPES) IMPLANT
DECANTER SPIKE VIAL GLASS SM (MISCELLANEOUS) IMPLANT
DIFFUSER DRILL AIR PNEUMATIC (MISCELLANEOUS) ×4 IMPLANT
DRAPE MICROSCOPE LEICA (MISCELLANEOUS) ×4 IMPLANT
DRAPE NEUROLOGICAL W/INCISE (DRAPES) ×4 IMPLANT
DRAPE STERI IOBAN 125X83 (DRAPES) IMPLANT
DRAPE WARM FLUID 44X44 (DRAPE) ×4 IMPLANT
DRILL WIRE PASS 1.3MM (BIT)
DRSG OPSITE POSTOP 4X8 (GAUZE/BANDAGES/DRESSINGS) IMPLANT
DRSG TEGADERM 2-3/8X2-3/4 SM (GAUZE/BANDAGES/DRESSINGS) ×12 IMPLANT
DRSG TELFA 3X8 NADH (GAUZE/BANDAGES/DRESSINGS) ×4 IMPLANT
DURAMATRIX ONLAY 2X2 (Neuro Prosthesis/Implant) ×4 IMPLANT
DURAPREP 6ML APPLICATOR 50/CS (WOUND CARE) ×4 IMPLANT
ELECT REM PT RETURN 9FT ADLT (ELECTROSURGICAL) ×4
ELECTRODE REM PT RTRN 9FT ADLT (ELECTROSURGICAL) ×2 IMPLANT
EVACUATOR 1/8 PVC DRAIN (DRAIN) IMPLANT
EVACUATOR SILICONE 100CC (DRAIN) IMPLANT
FORCEPS BIPOLAR SPETZLER 8 1.0 (NEUROSURGERY SUPPLIES) ×4 IMPLANT
GAUZE SPONGE 4X4 12PLY STRL (GAUZE/BANDAGES/DRESSINGS) IMPLANT
GAUZE SPONGE 4X4 16PLY XRAY LF (GAUZE/BANDAGES/DRESSINGS) IMPLANT
GLOVE BIO SURGEON STRL SZ8 (GLOVE) ×8 IMPLANT
GLOVE BIOGEL PI IND STRL 8 (GLOVE) ×6 IMPLANT
GLOVE BIOGEL PI IND STRL 8.5 (GLOVE) ×2 IMPLANT
GLOVE BIOGEL PI INDICATOR 8 (GLOVE) ×6
GLOVE BIOGEL PI INDICATOR 8.5 (GLOVE) ×2
GLOVE ECLIPSE 7.5 STRL STRAW (GLOVE) ×12 IMPLANT
GLOVE ECLIPSE 8.0 STRL XLNG CF (GLOVE) ×4 IMPLANT
GLOVE EXAM NITRILE LRG STRL (GLOVE) IMPLANT
GLOVE EXAM NITRILE XL STR (GLOVE) IMPLANT
GLOVE EXAM NITRILE XS STR PU (GLOVE) IMPLANT
GOWN STRL REUS W/ TWL LRG LVL3 (GOWN DISPOSABLE) IMPLANT
GOWN STRL REUS W/ TWL XL LVL3 (GOWN DISPOSABLE) ×2 IMPLANT
GOWN STRL REUS W/TWL 2XL LVL3 (GOWN DISPOSABLE) ×8 IMPLANT
GOWN STRL REUS W/TWL LRG LVL3 (GOWN DISPOSABLE)
GOWN STRL REUS W/TWL XL LVL3 (GOWN DISPOSABLE) ×2
HEMOSTAT SURGICEL 2X14 (HEMOSTASIS) ×4 IMPLANT
KIT BASIN OR (CUSTOM PROCEDURE TRAY) ×4 IMPLANT
KIT ROOM TURNOVER OR (KITS) ×4 IMPLANT
MARKER SKIN DUAL TIP RULER LAB (MISCELLANEOUS) ×4 IMPLANT
MARKER SPHERE PSV REFLC 13MM (MARKER) ×8 IMPLANT
NEEDLE HYPO 25X1 1.5 SAFETY (NEEDLE) ×4 IMPLANT
NS IRRIG 1000ML POUR BTL (IV SOLUTION) ×8 IMPLANT
OIL CARTRIDGE MAESTRO DRILL (MISCELLANEOUS) ×4
PACK CRANIOTOMY CUSTOM (CUSTOM PROCEDURE TRAY) ×4 IMPLANT
PAD ARMBOARD 7.5X6 YLW CONV (MISCELLANEOUS) ×16 IMPLANT
PATTIES SURGICAL .25X.25 (GAUZE/BANDAGES/DRESSINGS) IMPLANT
PATTIES SURGICAL .5 X.5 (GAUZE/BANDAGES/DRESSINGS) IMPLANT
PATTIES SURGICAL .5 X3 (DISPOSABLE) IMPLANT
PATTIES SURGICAL 1/4 X 3 (GAUZE/BANDAGES/DRESSINGS) IMPLANT
PATTIES SURGICAL 1X1 (DISPOSABLE) IMPLANT
PIN MAYFIELD SKULL DISP (PIN) ×4 IMPLANT
PLATE 1.5  2HOLE LNG NEURO (Plate) ×4 IMPLANT
PLATE 1.5 2HOLE LNG NEURO (Plate) ×4 IMPLANT
PLATE 1.5/0.5 13MM BURR HOLE (Plate) ×8 IMPLANT
RUBBERBAND STERILE (MISCELLANEOUS) ×8 IMPLANT
SCREW SELF DRILL HT 1.5/4MM (Screw) ×52 IMPLANT
SPECIMEN JAR SMALL (MISCELLANEOUS) IMPLANT
SPONGE NEURO XRAY DETECT 1X3 (DISPOSABLE) IMPLANT
SPONGE SURGIFOAM ABS GEL 100 (HEMOSTASIS) ×4 IMPLANT
STAPLER SKIN PROX WIDE 3.9 (STAPLE) ×8 IMPLANT
SUT ETHILON 3 0 FSL (SUTURE) IMPLANT
SUT NURALON 4 0 TR CR/8 (SUTURE) ×8 IMPLANT
SUT SILK 2 0 PERMA HAND 18 BK (SUTURE) IMPLANT
SUT VIC AB 2-0 CP2 18 (SUTURE) ×8 IMPLANT
SYR CONTROL 10ML LL (SYRINGE) IMPLANT
TOWEL GREEN STERILE (TOWEL DISPOSABLE) ×4 IMPLANT
TOWEL GREEN STERILE FF (TOWEL DISPOSABLE) ×4 IMPLANT
TRAY FOLEY W/METER SILVER 16FR (SET/KITS/TRAYS/PACK) ×4 IMPLANT
TUBE CONNECTING 12'X1/4 (SUCTIONS) ×1
TUBE CONNECTING 12X1/4 (SUCTIONS) ×3 IMPLANT
UNDERPAD 30X30 (UNDERPADS AND DIAPERS) IMPLANT
WATER STERILE IRR 1000ML POUR (IV SOLUTION) ×4 IMPLANT

## 2017-03-21 NOTE — Transfer of Care (Signed)
Immediate Anesthesia Transfer of Care Note  Patient: Marco Scott  Procedure(s) Performed: Left frontal craniotomy for brain tumor with Brainlab (Left Head) APPLICATION OF CRANIAL NAVIGATION (N/A Head)  Patient Location: PACU  Anesthesia Type:General  Level of Consciousness: awake and confused  Airway & Oxygen Therapy: Patient Spontanous Breathing and Patient connected to nasal cannula oxygen  Post-op Assessment: Report given to RN and Post -op Vital signs reviewed and stable  Post vital signs: Reviewed and stable  Last Vitals:  Vitals:   03/21/17 0602  BP: 109/65  Pulse: 65  Resp: 18  Temp: 36.8 C  SpO2: 100%    Last Pain:  Vitals:   03/21/17 0602  TempSrc: Oral         Complications: No apparent anesthesia complications

## 2017-03-21 NOTE — Anesthesia Preprocedure Evaluation (Signed)
Anesthesia Evaluation  Patient identified by MRN, date of birth, ID band Patient awake    Reviewed: Allergy & Precautions, NPO status , Patient's Chart, lab work & pertinent test results  Airway Mallampati: II  TM Distance: >3 FB Neck ROM: Full    Dental  (+) Teeth Intact, Dental Advisory Given   Pulmonary Current Smoker,    breath sounds clear to auscultation       Cardiovascular  Rhythm:Regular Rate:Normal     Neuro/Psych    GI/Hepatic   Endo/Other    Renal/GU      Musculoskeletal   Abdominal   Peds  Hematology   Anesthesia Other Findings   Reproductive/Obstetrics                             Anesthesia Physical Anesthesia Plan  ASA: III  Anesthesia Plan: General   Post-op Pain Management:    Induction: Intravenous  PONV Risk Score and Plan: Ondansetron and Dexamethasone  Airway Management Planned: Oral ETT  Additional Equipment: Arterial line and CVP  Intra-op Plan:   Post-operative Plan: Extubation in OR  Informed Consent: I have reviewed the patients History and Physical, chart, labs and discussed the procedure including the risks, benefits and alternatives for the proposed anesthesia with the patient or authorized representative who has indicated his/her understanding and acceptance.   Dental advisory given  Plan Discussed with: Anesthesiologist and CRNA  Anesthesia Plan Comments:         Anesthesia Quick Evaluation

## 2017-03-21 NOTE — Brief Op Note (Signed)
03/21/2017  9:27 AM  PATIENT:  Marco Scott  22 y.o. 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  PROCEDURE:  Procedure(s) with comments: Left frontal craniotomy for brain tumor with Brainlab (Left) - left frontal APPLICATION OF CRANIAL NAVIGATION (N/A)  SURGEON:  Surgeon(s) and Role:    Erline Levine, MD - Primary    * Earnie Larsson, MD - Assisting  PHYSICIAN ASSISTANT:   ASSISTANTS: [place], RN   ANESTHESIA:   general  EBL:  100 mL   BLOOD ADMINISTERED:none  DRAINS: none   LOCAL MEDICATIONS USED:  MARCAINE    and LIDOCAINE   SPECIMEN:  Excision  DISPOSITION OF SPECIMEN:  PATHOLOGY  COUNTS:  YES  TOURNIQUET:  * No tourniquets in log *  DICTATION: Patient is 22 year old man with newly diagnosed brain tumor. He presented with seizures.  It was elected to take him to surgery for craniotomy for left frontal brain tumor.  He had preop MRI for use of Brainlab Curve for surgical localization of tumor.  Procedure:  Following smooth intubation, patient was placed in supine position on bed.  Head was placed in pins and frontal scalp was shaved and prepped and draped in usual sterile fashion after Curve MRI was localized to map tumor location.  Area of planned incision was infiltrated with lidocaine. A linear bicoronal incision was made and carried through temporalis fascia and muscle to expose calvarium.  Skull flap was elevated exposing the dura directly overlying the brain mass.  Dura was opened.  A corticotomy was created overlying the tumor and carried to remove the primary brain tumor.  The Curve was used to confirm extent of tumor resection. The tumor cavity appeared cystic with a glial lining.  This was removed to normal appearing brain in all directions, except along the midline, where subpial dissection was performed and brain was removed to the falx.  Hemostasis was assured  with irrigation and surgifoam.  Hemostasis was assured.  A Dura Matrix onlay graft was placed. The bone flap was replaced with plates, the fascia and galea were closed with 2-0 vicryl sutures and the skin was re approximated with staples.  A sterile occlusive dressing was placed.  Patient was returned to a supine position and taken out of head pins, then extubated in the operating room, having tolerated surgery well.  Counts were correct at the end of the case.  PLAN OF CARE: Admit to inpatient   PATIENT DISPOSITION:  PACU - hemodynamically stable.   Delay start of Pharmacological VTE agent (>24hrs) due to surgical blood loss or risk of bleeding: yes

## 2017-03-21 NOTE — Interval H&P Note (Signed)
History and Physical Interval Note:  03/21/2017 7:25 AM  Marco Scott  has presented today for surgery, with the diagnosis of Brain Tumor  The various methods of treatment have been discussed with the patient and family. After consideration of risks, benefits and other options for treatment, the patient has consented to  Procedure(s) with comments: Left frontal craniotomy for brain tumor with Brainlab (Left) - Left frontal craniotomy for brain tumor with [place] (N/A) as a surgical intervention .  The patient's history has been reviewed, patient examined, no change in status, stable for surgery.  I have reviewed the patient's chart and labs.  Questions were answered to the patient's satisfaction.     Zoriah Pulice D

## 2017-03-21 NOTE — Anesthesia Procedure Notes (Signed)
Arterial Line Insertion Start/End3/05/2017 6:50 AM, 03/21/2017 7:15 AM Performed by: Imagene Riches, CRNA, CRNA  Patient location: Pre-op. Preanesthetic checklist: patient identified, IV checked, surgical consent, monitors and equipment checked and pre-op evaluation Lidocaine 1% used for infiltration Left, radial was placed Hand hygiene performed  and maximum sterile barriers used   Attempts: 3 Procedure performed without using ultrasound guided technique. Following insertion, dressing applied and Biopatch. Post procedure assessment: normal  Patient tolerated the procedure well with no immediate complications.

## 2017-03-21 NOTE — Anesthesia Procedure Notes (Signed)
Procedure Name: Intubation Date/Time: 03/21/2017 7:44 AM Performed by: Imagene Riches, CRNA Pre-anesthesia Checklist: Patient identified, Emergency Drugs available, Suction available and Patient being monitored Patient Re-evaluated:Patient Re-evaluated prior to induction Oxygen Delivery Method: Circle system utilized Preoxygenation: Pre-oxygenation with 100% oxygen Induction Type: IV induction Ventilation: Mask ventilation without difficulty and Oral airway inserted - appropriate to patient size Laryngoscope Size: Mac and 3 Grade View: Grade I Tube type: Oral Tube size: 7.5 mm Number of attempts: 1 Airway Equipment and Method: Stylet Placement Confirmation: ETT inserted through vocal cords under direct vision,  positive ETCO2 and breath sounds checked- equal and bilateral Secured at: 23 cm Tube secured with: Tape Dental Injury: Teeth and Oropharynx as per pre-operative assessment

## 2017-03-21 NOTE — Anesthesia Postprocedure Evaluation (Signed)
Anesthesia Post Note  Patient: Marco Scott  Procedure(s) Performed: Left frontal craniotomy for brain tumor with Brainlab (Left Head) APPLICATION OF CRANIAL NAVIGATION (N/A Head)     Patient location during evaluation: PACU Anesthesia Type: General Level of consciousness: awake and alert Pain management: pain level controlled Vital Signs Assessment: post-procedure vital signs reviewed and stable Respiratory status: spontaneous breathing, nonlabored ventilation, respiratory function stable and patient connected to nasal cannula oxygen Cardiovascular status: blood pressure returned to baseline and stable Postop Assessment: no apparent nausea or vomiting Anesthetic complications: no    Last Vitals:  Vitals:   03/21/17 1600 03/21/17 1700  BP: 107/77 (!) 119/105  Pulse: 84 73  Resp: 16 20  Temp: (!) 36.4 C   SpO2: 94% 95%    Last Pain:  Vitals:   03/21/17 1700  TempSrc:   PainSc: 4                  Niralya Ohanian COKER

## 2017-03-21 NOTE — Op Note (Signed)
03/21/2017  9:27 AM  PATIENT:  Marco Scott  22 y.o. 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  PROCEDURE:  Procedure(s) with comments: Left frontal craniotomy for brain tumor with Brainlab (Left) - left frontal APPLICATION OF CRANIAL NAVIGATION (N/A)  SURGEON:  Surgeon(s) and Role:    Erline Levine, MD - Primary    * Earnie Larsson, MD - Assisting  PHYSICIAN ASSISTANT:   ASSISTANTS: Poteat, RN   ANESTHESIA:   general  EBL:  100 mL   BLOOD ADMINISTERED:none  DRAINS: none   LOCAL MEDICATIONS USED:  MARCAINE    and LIDOCAINE   SPECIMEN:  Excision  DISPOSITION OF SPECIMEN:  PATHOLOGY  COUNTS:  YES  TOURNIQUET:  * No tourniquets in log *  DICTATION: Patient is 23 year old man with newly diagnosed brain tumor. He presented with seizures.  It was elected to take him to surgery for craniotomy for left frontal brain tumor.  He had preop MRI for use of Brainlab Curve for surgical localization of tumor.  Procedure:  Following smooth intubation, patient was placed in supine position on bed.  Head was placed in pins and frontal scalp was shaved and prepped and draped in usual sterile fashion after Curve MRI was localized to map tumor location.  Area of planned incision was infiltrated with lidocaine. A linear bicoronal incision was made and carried through temporalis fascia and muscle to expose calvarium.  Skull flap was elevated exposing the dura directly overlying the brain mass.  Dura was opened.  A corticotomy was created overlying the tumor and carried to remove the primary brain tumor.  The Curve was used to confirm extent of tumor resection. The tumor cavity appeared cystic with a glial lining.  This was removed to normal appearing brain in all directions, except along the midline, where subpial dissection was performed and brain was removed to the falx.  Hemostasis was assured  with irrigation and surgifoam.  Hemostasis was assured.  A Dura Matrix onlay graft was placed. The bone flap was replaced with plates, the fascia and galea were closed with 2-0 vicryl sutures and the skin was re approximated with staples.  A sterile occlusive dressing was placed.  Patient was returned to a supine position and taken out of head pins, then extubated in the operating room, having tolerated surgery well.  Counts were correct at the end of the case.  PLAN OF CARE: Admit to inpatient   PATIENT DISPOSITION:  PACU - hemodynamically stable.   Delay start of Pharmacological VTE agent (>24hrs) due to surgical blood loss or risk of bleeding: yes

## 2017-03-21 NOTE — Progress Notes (Signed)
Patient ID: Marco Scott, male   DOB: 07-01-1995, 22 y.o.   MRN: [id number] Alert, conversant. Mother present and appropriately attentive. Reports no headache. MAEW. PEARL. Drsg intact. Minimal bloody drainage beneath op-site.

## 2017-03-21 NOTE — Anesthesia Procedure Notes (Addendum)
Central Venous Catheter Insertion Performed by: Roberts Gaudy, MD, anesthesiologist Start/End3/05/2017 7:00 AM, 03/21/2017 7:10 AM Patient location: Pre-op. Preanesthetic checklist: patient identified, IV checked, site marked, risks and benefits discussed, surgical consent, monitors and equipment checked, pre-op evaluation, timeout performed and anesthesia consent Lidocaine 1% used for infiltration and patient sedated Hand hygiene performed  and maximum sterile barriers used  Catheter size: 8 Fr Total catheter length 16. Central line was placed.Double lumen Procedure performed using ultrasound guided technique. Ultrasound Notes:image(s) printed for medical record Attempts: 1 Following insertion, dressing applied and line sutured. Post procedure assessment: blood return through all ports  Patient tolerated the procedure well with no immediate complications.

## 2017-03-21 NOTE — Progress Notes (Signed)
Awake, alert, conversant.  MAEW.  Doing well.  

## 2017-03-21 NOTE — Progress Notes (Signed)
Vape pen found in patient's bed while getting up to bathroom. Patient educated on not using vape pen while in hospital. Pen given to patient's mother and mother took home with her.

## 2017-03-22 ENCOUNTER — Inpatient Hospital Stay (HOSPITAL_COMMUNITY): Payer: Medicaid Other

## 2017-03-22 ENCOUNTER — Encounter (HOSPITAL_COMMUNITY): Payer: Self-pay | Admitting: Neurosurgery

## 2017-03-22 DIAGNOSIS — G9389 Other specified disorders of brain: Secondary | ICD-10-CM

## 2017-03-22 DIAGNOSIS — F17210 Nicotine dependence, cigarettes, uncomplicated: Secondary | ICD-10-CM

## 2017-03-22 DIAGNOSIS — G40909 Epilepsy, unspecified, not intractable, without status epilepticus: Secondary | ICD-10-CM

## 2017-03-22 DIAGNOSIS — Z9889 Other specified postprocedural states: Secondary | ICD-10-CM

## 2017-03-22 DIAGNOSIS — Z888 Allergy status to other drugs, medicaments and biological substances status: Secondary | ICD-10-CM

## 2017-03-22 DIAGNOSIS — Z79899 Other long term (current) drug therapy: Secondary | ICD-10-CM

## 2017-03-22 MED ORDER — GADOBENATE DIMEGLUMINE 529 MG/ML IV SOLN
10.0000 mL | Freq: Once | INTRAVENOUS | Status: AC
  Administered 2017-03-22: 10 mL via INTRAVENOUS

## 2017-03-22 MED ORDER — LEVETIRACETAM 500 MG PO TABS
500.0000 mg | ORAL_TABLET | Freq: Two times a day (BID) | ORAL | Status: DC
  Administered 2017-03-22: 500 mg via ORAL
  Filled 2017-03-22: qty 1

## 2017-03-22 MED FILL — Thrombin For Soln 5000 Unit: CUTANEOUS | Qty: 5000 | Status: AC

## 2017-03-22 MED FILL — Thrombin For Soln 20000 Unit: CUTANEOUS | Qty: 1 | Status: AC

## 2017-03-22 NOTE — Discharge Instructions (Signed)
Ok to remove dressing, leave open to air. May cleanse gently with shower.

## 2017-03-22 NOTE — Progress Notes (Signed)
MRI called to schedule follow up MRI, RN asked if head staples were okay for this scan. MRI tech unsure and will ask the daytime staff. Will delay scan until we know for certain head staples are safe in the MRI machine.  WCTM

## 2017-03-22 NOTE — Progress Notes (Signed)
Pt was escorted to the elevators with his mother.  He refused to ride in a wheelchair to be discharged.  Katherine Mantle RN

## 2017-03-22 NOTE — Discharge Summary (Signed)
Physician Discharge Summary  Patient ID: Marco Scott MRN: [id number] DOB/AGE: 08-25-1995 22 y.o.  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 Following uncomplicated left frontal craniotomy, he recovered nicely and transferred to Neuro ICU. He is mobilizing well. No seizure activity. Follow up MRI revealed gross total resection of tumor.   Consults:   Significant Diagnostic Studies: radiology: MRI: pre-op and post-op BrainLab protocol MRI's  Treatments: surgery: Left frontal craniotomy for brain tumor with Brainlab (Left) - left frontal APPLICATION OF CRANIAL NAVIGATION (N/A)    Discharge Exam: Blood pressure 96/60, pulse 68, temperature 98.8 F (37.1 C), temperature source Oral, resp. rate 12, height 5\' 8"  (1.727 m), weight 47.7 kg (105 lb 1.6 oz), SpO2 98 %. Patient is doing well. No seizures. Postop MRI reveals gross total resection of tumor. .     Disposition: Discharge to home. Ok to remove dressing, leave open to air. May cleanse gently with shower. Return to office for staple removal in 2weeks.       Allergies as of 03/22/2017      Reactions   Gadolinium Derivatives Nausea And Vomiting      Medication List    TAKE these medications   lamoTRIgine 25 MG tablet Commonly known as:  LAMICTAL Take 2 tablets (50 mg total) by mouth daily for 14 days.   levETIRAcetam 500 MG tablet Commonly known as:  KEPPRA Take 1 tablet (500 mg total) by mouth 2 (two) times daily.        Signed: Peggyann Shoals,  MD 03/22/2017, 4:45 PM

## 2017-03-22 NOTE — Consult Note (Signed)
[facility] CONSULT NOTE  Patient Care Team: Patient, No Pcp Per as PCP - General (General Practice)  CHIEF COMPLAINTS/PURPOSE OF CONSULTATION:  Brain tumor resection  HISTORY OF PRESENTING ILLNESS:  Marco Scott 22 y.o. male presented for craniotomy and resection of left frontal mass lesion with Dr. Vertell Limber.  He underwent resection yesterday AM, tolerated well with no complications.  Pathology is pending at this time.  There are no new or progressive neurologic deficits, seizures controlled on Keppra and Lamictal.  He feels "ready to go home".  MEDICAL HISTORY:  Past Medical History:  Diagnosis Date  . Seizure ([place])    FOR LAST 4 MONTHS    SURGICAL HISTORY: Past Surgical History:  Procedure Laterality Date  . TOOTH EXTRACTION      SOCIAL HISTORY: Social History   Socioeconomic History  . Marital status: Single    Spouse name: Not on file  . Number of children: Not on file  . Years of education: Not on file  . Highest education level: Not on file  Social Needs  . Financial resource strain: Somewhat hard  . Food insecurity - worry: Patient refused  . Food insecurity - inability: Patient refused  . Transportation needs - medical: Patient refused  . Transportation needs - non-medical: Patient refused  Occupational History  . Not on file  Tobacco Use  . Smoking status: Current Some Day Smoker    Types: Cigarettes  . Smokeless tobacco: Never Used  Substance and Sexual Activity  . Alcohol use: No    Frequency: Never  . Drug use: Yes    Types: Marijuana  . Sexual activity: Not Currently  Other Topics Concern  . Not on file  Social History Narrative   Currently on house arrest and wearing ankle monitor    FAMILY HISTORY: History reviewed. No pertinent family history.  ALLERGIES:  is allergic to gadolinium derivatives.  MEDICATIONS:  Current Facility-Administered Medications  Medication Dose Route Frequency Provider Last Rate Last  Dose  . 0.9 % NaCl with KCl 20 mEq/ L  infusion   Intravenous Continuous Erline Levine, MD   Stopped at 03/22/17 0900  . acetaminophen (TYLENOL) tablet 650 mg  650 mg Oral Q4H PRN Erline Levine, MD       Or  . acetaminophen (TYLENOL) suppository 650 mg  650 mg Rectal Q4H PRN Erline Levine, MD      . bisacodyl (DULCOLAX) suppository 10 mg  10 mg Rectal Daily PRN Erline Levine, MD      . dexamethasone (DECADRON) injection 4 mg  4 mg Intravenous Q6H Erline Levine, MD       Followed by  . [START ON 03/23/2017] dexamethasone (DECADRON) injection 4 mg  4 mg Intravenous Q8H Erline Levine, MD      . docusate sodium (COLACE) capsule 100 mg  100 mg Oral BID Erline Levine, MD      . HYDROcodone-acetaminophen (NORCO/VICODIN) 5-325 MG per tablet 1 tablet  1 tablet Oral Q4H PRN Erline Levine, MD   1 tablet at 03/22/17 0809  . labetalol (NORMODYNE,TRANDATE) injection 10-40 mg  10-40 mg Intravenous Q10 min PRN Erline Levine, MD      . levETIRAcetam (KEPPRA) tablet 500 mg  500 mg Oral BID Erline Levine, MD      . morphine 4 MG/ML injection 1-2 mg  1-2 mg Intravenous Q2H PRN Erline Levine, MD   2 mg at 03/22/17 0433  . ondansetron (ZOFRAN) tablet 4 mg  4 mg Oral Q4H PRN Vertell Limber,  Broadus John, MD       Or  . ondansetron [facility]) injection 4 mg  4 mg Intravenous Q4H PRN Erline Levine, MD   4 mg at 03/22/17 0810  . pantoprazole (PROTONIX) injection 40 mg  40 mg Intravenous QHS Erline Levine, MD   40 mg at 03/21/17 2056  . polyethylene glycol (MIRALAX / GLYCOLAX) packet 17 g  17 g Oral Daily PRN Erline Levine, MD      . promethazine (PHENERGAN) tablet 12.5-25 mg  12.5-25 mg Oral Q4H PRN Erline Levine, MD   12.5 mg at 03/21/17 2055  . sodium phosphate (FLEET) 7-19 GM/118ML enema 1 enema  1 enema Rectal Once PRN Erline Levine, MD        REVIEW OF SYSTEMS:   Constitutional: Denies fevers, chills or abnormal weight loss Eyes: Denies blurriness of vision Ears, nose, mouth, throat, and face: Denies mucositis or sore  throat Respiratory: Denies cough, dyspnea or wheezes Cardiovascular: Denies palpitation, chest discomfort or lower extremity swelling Gastrointestinal:  Denies nausea, constipation, diarrhea GU: Denies dysuria or incontinence Skin: Denies abnormal skin rashes Neurological: Per HPI Musculoskeletal: Denies joint pain, back or neck discomfort. No decrease in ROM Behavioral/Psych: Denies anxiety, disturbance in thought content, and mood instability   PHYSICAL EXAMINATION: Vitals:   03/22/17 0800 03/22/17 0900  BP:  121/84  Pulse:    Resp:  19  Temp: 98.8 F (37.1 C)   SpO2:     KPS: 90. General: Alert, cooperative, pleasant, in no acute distress Head: Craniotomy scar noted, dry and intact. EENT: No conjunctival injection or scleral icterus. Oral mucosa moist Lungs: Resp effort normal Cardiac: Regular rate and rhythm Abdomen: Soft, non-distended abdomen Skin: No rashes cyanosis or petechiae. Extremities: No clubbing or edema  NEUROLOGIC EXAM: Mental Status: Awake, alert, attentive to examiner. Oriented to self and environment. Language is fluent with intact comprehension.  Cranial Nerves: Visual acuity is grossly normal. Visual fields are full. Extra-ocular movements intact. No ptosis. Face is symmetric, tongue midline. Motor: Tone and bulk are normal. Power is full in both arms and legs. Reflexes are symmetric, no pathologic reflexes present. Intact finger to nose bilaterally Sensory: Intact to light touch and temperature Gait: Deferred  LABORATORY DATA:  I have reviewed the data as listed Lab Results  Component Value Date   WBC 8.0 03/15/2017   HGB 13.7 03/15/2017   HCT 40.2 03/15/2017   MCV 95.5 03/15/2017   PLT 255 03/15/2017   Recent Labs    11/06/16 1033 12/17/16 1552 12/18/16 0316 03/15/17 1317  NA 132* 135 137 136  K 4.0 3.4* 3.6 3.7  CL 102 104 112* 103  CO2 25 24 20* 23  GLUCOSE 87 61* 79 78  BUN 13 14 12 10   CREATININE 0.98 1.05 0.95 0.92  CALCIUM  9.3 9.4 8.6* 9.5  GFRNONAA >60 >60 >60 >60  GFRAA >60 >60 >60 >60  PROT 7.5 6.8  --   --   ALBUMIN 4.2 3.8  --   --   AST 29 26  --   --   ALT 10* 12*  --   --   ALKPHOS 61 59  --   --   BILITOT 0.8 0.7  --   --     RADIOGRAPHIC STUDIES: I have personally reviewed the radiological images as listed and agreed with the findings in the report.  Mr Jeri Cos Wo Contrast  Result Date: 03/15/2017 CLINICAL DATA:  Brain tumor. EXAM: MRI HEAD WITHOUT AND WITH CONTRAST TECHNIQUE:  Multiplanar, multiecho pulse sequences of the brain and surrounding structures were obtained without and with intravenous contrast. CONTRAST:  22mL MULTIHANCE GADOBENATE DIMEGLUMINE 529 MG/ML IV SOLN COMPARISON:  MRI brain 02/23/2017 and 12/17/2016. FINDINGS: Brain: A T2 hyperintense nonenhancing mass lesion in the left superior frontal gyrus anteriorly is stable, measuring 11 x 16 x 15 mm. No additional lesions are present. No acute infarct, hemorrhage, or other mass lesion is present. No significant white matter disease is present. Ventricles are of normal size. No significant extra-axial fluid collection is present. Internal auditory canals are normal. The brainstem and cerebellum are normal. No pathologic enhancement is present. Vascular: Flow is present in the major intracranial arteries. Skull and upper cervical spine: The skull base is within normal limits. Prominent adenoid tissue is noted. This is likely within normal limits for age. The craniocervical junction is normal. Sinuses/Orbits: The paranasal sinuses and mastoid air cells are clear. Globes and orbits are within normal limits. IMPRESSION: 1. Stable T2 hyperintense nonenhancing mass lesion in the left superior frontal gyrus as described. 2. No significant mass effect or surrounding edema. 3. No other focal lesions.  MRI of the brain is otherwise normal. Electronically Signed   By: San Morelle M.D.   On: 03/15/2017 16:53   Mr Jeri Cos GU Contrast  Result Date:  02/23/2017 CLINICAL DATA:  Follow up of brain abnormality seen on prior imaging. Possible tumor or cyst. EXAM: MRI HEAD WITHOUT AND WITH CONTRAST TECHNIQUE: Multiplanar, multiecho pulse sequences of the brain and surrounding structures were obtained without and with intravenous contrast. CONTRAST:  34mL MULTIHANCE GADOBENATE DIMEGLUMINE 529 MG/ML IV SOLN COMPARISON:  Brain MRI 12/17/2016 FINDINGS: Brain: The midline structures are normal. There is no acute infarct or acute hemorrhage. Cortically based lesion at the anterior aspect of the left superior frontal gyrus is unchanged in size, measuring 16 x 13 mm. No associated contrast enhancement or surrounding signal abnormality. No age-advanced or lobar predominant atrophy. No chronic microhemorrhage or superficial siderosis. Vascular: Major intracranial arterial and venous sinus flow voids are preserved. Skull and upper cervical spine: The visualized skull base, calvarium, upper cervical spine and extracranial soft tissues are normal. Sinuses/Orbits: No fluid levels or advanced mucosal thickening. No mastoid or middle ear effusion. Normal orbits. IMPRESSION: Unchanged size and appearance of left superior frontal gyrus cystic lesion. The appearance remains nonspecific with differential considerations including neuroepithelial cyst, ganglioglioma or, less likely, dysembryoplastic neuroepithelial tumor (DNET). Electronically Signed   By: Ulyses Jarred M.D.   On: 02/23/2017 15:33   Dg Chest Port 1 View  Result Date: 03/21/2017 CLINICAL DATA:  Central line placement, post craniotomy for tumor resection EXAM: PORTABLE CHEST 1 VIEW COMPARISON:  Portable exam 1019 hours without priors for comparison FINDINGS: RIGHT jugular line with tip projecting over cavoatrial junction. Normal heart size mediastinal contours. Diffuse BILATERAL pulmonary infiltrates which could represent edema or infection. No pleural effusion or pneumothorax. Gaseous distention of stomach. IMPRESSION: No  pneumothorax following RIGHT jugular line placement. Diffuse BILATERAL pulmonary infiltrates greatest in RIGHT upper lobe question edema versus infection. Gaseous distention of stomach. Electronically Signed   By: Lavonia Dana M.D.   On: 03/21/2017 10:43    ASSESSMENT & PLAN:   Left Frontal Brain Lesion  Mr. Ligman is clinically stable now POD#1 from gross total resection.  Post-op MRI is pending.    Histology and post-op scan will be reviewed in multidisciplinary tumor board on 03/27/17.   We will also follow up with him in clinic next week to  continue Lamictal titration.  All questions were answered. The patient knows to call the clinic with any problems, questions or concerns.  The total time spent in the encounter was 40 minutes and more than 50% was on counseling and review of test results     Ventura Sellers, MD 03/22/2017 10:34 AM

## 2017-03-22 NOTE — Progress Notes (Addendum)
Subjective: Patient reports "I'm ready to go home"  Objective: Vital signs in last 24 hours: Temp:  [97.5 F (36.4 C)-98.4 F (36.9 C)] 98.2 F (36.8 C) (03/06 0351) Pulse Rate:  [65-122] 82 (03/06 0750) Resp:  [14-33] 33 (03/06 0750) BP: (92-141)/(45-106) 122/68 (03/06 0700) SpO2:  [92 %-100 %] 99 % (03/06 0750) Arterial Line BP: (116-155)/(59-75) 142/73 (03/05 1220)  Intake/Output from previous day: 03/05 0701 - 03/06 0700 In: 2557.5 [I.V.:2507.5; IV Piggyback:50] Out: 650 [Urine:550; Blood:100] Intake/Output this shift: No intake/output data recorded.  Alert, conversant. Incision beneath Telfa with no new bleeding. Drsg intact. MAEW. PEARL. No drift.   Lab Results: No results for input(s): WBC, HGB, HCT, PLT in the last 72 hours. BMET No results for input(s): NA, K, CL, CO2, GLUCOSE, BUN, CREATININE, CALCIUM in the last 72 hours.  Studies/Results: Dg Chest Port 1 View  Result Date: 03/21/2017 CLINICAL DATA:  Central line placement, post craniotomy for tumor resection EXAM: PORTABLE CHEST 1 VIEW COMPARISON:  Portable exam 1019 hours without priors for comparison FINDINGS: RIGHT jugular line with tip projecting over cavoatrial junction. Normal heart size mediastinal contours. Diffuse BILATERAL pulmonary infiltrates which could represent edema or infection. No pleural effusion or pneumothorax. Gaseous distention of stomach. IMPRESSION: No pneumothorax following RIGHT jugular line placement. Diffuse BILATERAL pulmonary infiltrates greatest in RIGHT upper lobe question edema versus infection. Gaseous distention of stomach. Electronically Signed   By: Lavonia Dana M.D.   On: 03/21/2017 10:43    Assessment/Plan:   LOS: 1 day  Brain MRI scheduled for 10am. mobilize as tolerated.   Verdis Prime 03/22/2017, 8:23 AM  Patient is doing well.  No seizures.  Postop MRI today.  Discharge home if doing well.

## 2017-03-28 ENCOUNTER — Telehealth: Payer: Self-pay | Admitting: *Deleted

## 2017-03-28 NOTE — Telephone Encounter (Signed)
Patient called to ask if we could remove all contacts aside from himself from his account.  He does not want any communication with anyone except himself.  Removed all contacts.  Moved appt up as patient states that Dr Vertell Limber has discharged him without anything except for Tylenol and he was having "headaches upon migraines" and his scalp felt like it was pulling.  Moved appt up to be seen tomorrow.  He states he is out of Lamictal and needs refills.

## 2017-03-29 ENCOUNTER — Ambulatory Visit: Payer: Self-pay | Admitting: Internal Medicine

## 2017-03-30 ENCOUNTER — Inpatient Hospital Stay: Payer: Medicaid Other | Attending: Internal Medicine | Admitting: Internal Medicine

## 2017-03-30 ENCOUNTER — Telehealth: Payer: Self-pay | Admitting: *Deleted

## 2017-03-30 ENCOUNTER — Encounter: Payer: Self-pay | Admitting: Internal Medicine

## 2017-03-30 VITALS — BP 107/69 | HR 78 | Temp 99.0°F | Resp 18 | Ht 68.0 in | Wt 107.2 lb

## 2017-03-30 DIAGNOSIS — R569 Unspecified convulsions: Secondary | ICD-10-CM | POA: Insufficient documentation

## 2017-03-30 DIAGNOSIS — Z79899 Other long term (current) drug therapy: Secondary | ICD-10-CM | POA: Diagnosis not present

## 2017-03-30 DIAGNOSIS — F17210 Nicotine dependence, cigarettes, uncomplicated: Secondary | ICD-10-CM | POA: Insufficient documentation

## 2017-03-30 DIAGNOSIS — D496 Neoplasm of unspecified behavior of brain: Secondary | ICD-10-CM | POA: Diagnosis not present

## 2017-03-30 DIAGNOSIS — G9389 Other specified disorders of brain: Secondary | ICD-10-CM | POA: Insufficient documentation

## 2017-03-30 MED ORDER — LAMOTRIGINE 100 MG PO TABS: 100.0000 mg | ORAL_TABLET | Freq: Two times a day (BID) | ORAL | 3 refills | Status: DC

## 2017-03-30 MED ORDER — IBUPROFEN 800 MG PO TABS: 800.0000 mg | ORAL_TABLET | Freq: Three times a day (TID) | ORAL | 2 refills | Status: AC | PRN

## 2017-03-30 NOTE — Telephone Encounter (Signed)
FYI "Calling to speak with Dr. Mickeal Skinner.  I saw another doctor this afternoon after seeing him..  None of the other doctors take me seriously except him.   Passed over scheduling due to having to get to my other appointment.  I also need to schedule the 3 month F/U and MRI.  Return number [phone]."     Provider notified of call information.  Provided the Radiology Central Scheduling number [phone] to schedule MRI.

## 2017-03-30 NOTE — Progress Notes (Signed)
[place] [facility] [place], [place] [address] [phone]   Interval Evaluation  Date of Service: 03/30/17 Patient Name: Marco Scott Patient MRN: [id number] Patient DOB: 02-23-1995 Provider: Ventura Sellers, MD  Identifying Statement:  Marco Scott is a 22 y.o. male with left frontal brain mass   Interval History:  Marco Scott presents today for follow up after recent craniotomy and resection.  He tolerated surgery well without any complications.  There have been no seizures in the post-operative period. He new or progressive neurologic deficits.  He does complain of pain in the head at the surgical site, with minimal relief from Tylenol.  Medications: Current Outpatient Medications on File Prior to Visit  Medication Sig Dispense Refill  . lamoTRIgine (LAMICTAL) 25 MG tablet Take 2 tablets (50 mg total) by mouth daily for 14 days. (Patient not taking: Reported on 03/15/2017) 28 tablet 0  . levETIRAcetam (KEPPRA) 500 MG tablet Take 1 tablet (500 mg total) by mouth 2 (two) times daily. 180 tablet 0   No current facility-administered medications on file prior to visit.     Allergies:  Allergies  Allergen Reactions  . Gadolinium Derivatives Nausea And Vomiting   Past Medical History:  Past Medical History:  Diagnosis Date  . Seizure ([place])    FOR LAST 4 MONTHS   Past Surgical History:  Past Surgical History:  Procedure Laterality Date  . APPLICATION OF CRANIAL NAVIGATION N/A 03/21/2017   Procedure: APPLICATION OF CRANIAL NAVIGATION;  Surgeon: Erline Levine, MD;  Location: [place];  Service: Neurosurgery;  Laterality: N/A;  . CRANIOTOMY Left 03/21/2017   Procedure: Left frontal craniotomy for brain tumor with Brainlab;  Surgeon: Erline Levine, MD;  Location: [place];  Service: Neurosurgery;  Laterality: Left;  left frontal  . TOOTH EXTRACTION     Social History:  Social History   Socioeconomic History  . Marital status: Single      Spouse name: Not on file  . Number of children: Not on file  . Years of education: Not on file  . Highest education level: Not on file  Social Needs  . Financial resource strain: Somewhat hard  . Food insecurity - worry: Patient refused  . Food insecurity - inability: Patient refused  . Transportation needs - medical: Patient refused  . Transportation needs - non-medical: Patient refused  Occupational History  . Not on file  Tobacco Use  . Smoking status: Current Some Day Smoker    Types: Cigarettes  . Smokeless tobacco: Never Used  Substance and Sexual Activity  . Alcohol use: No    Frequency: Never  . Drug use: Yes    Types: Marijuana  . Sexual activity: Not Currently  Other Topics Concern  . Not on file  Social History Narrative   Currently on house arrest and wearing ankle monitor   Family History: No family history on file.  Review of Systems: Constitutional: Denies fevers, chills or abnormal weight loss Eyes: Denies blurriness of vision Ears, nose, mouth, throat, and face: Denies mucositis or sore throat Respiratory: Denies cough, dyspnea or wheezes Cardiovascular: Denies palpitation, chest discomfort or lower extremity swelling Gastrointestinal:  Denies nausea, constipation, diarrhea GU: Denies dysuria or incontinence Skin: Denies abnormal skin rashes Neurological: Per HPI Musculoskeletal: Denies joint pain, back or neck discomfort. No decrease in ROM Behavioral/Psych: Denies anxiety, disturbance in thought content, and mood instability  Physical Exam: Vitals:   03/30/17 1224  BP: 107/69  Pulse: 78  Resp: 18  Temp: 99 F (37.2 C)  SpO2: 100%   KPS: 100. General: Alert, cooperative, pleasant, in no acute distress Head: Craniotomy scar, C/D/I EENT: No conjunctival injection or scleral icterus. Oral mucosa moist Lungs: Resp effort normal Cardiac: Regular rate and rhythm Abdomen: Soft, non-distended abdomen Skin: No rashes cyanosis or  petechiae. Extremities: No clubbing or edema  Neurologic Exam: Mental Status: Awake, alert, attentive to examiner. Oriented to self and environment. Language is fluent with intact comprehension.  Cranial Nerves: Visual acuity is grossly normal. Visual fields are full. Extra-ocular movements intact. No ptosis. Face is symmetric, tongue midline. Motor: Tone and bulk are normal. Power is full in both arms and legs. Reflexes are symmetric, no pathologic reflexes present. Intact finger to nose bilaterally Sensory: Intact to light touch and temperature Gait: Normal and tandem gait is normal.   Labs: I have reviewed the data as listed    Component Value Date/Time   NA 136 03/15/2017 1317   K 3.7 03/15/2017 1317   CL 103 03/15/2017 1317   CO2 23 03/15/2017 1317   GLUCOSE 78 03/15/2017 1317   BUN 10 03/15/2017 1317   CREATININE 0.92 03/15/2017 1317   CALCIUM 9.5 03/15/2017 1317   PROT 6.8 12/17/2016 1552   ALBUMIN 3.8 12/17/2016 1552   AST 26 12/17/2016 1552   ALT 12 (L) 12/17/2016 1552   ALKPHOS 59 12/17/2016 1552   BILITOT 0.7 12/17/2016 1552   GFRNONAA >60 03/15/2017 1317   GFRAA >60 03/15/2017 1317   Lab Results  Component Value Date   WBC 8.0 03/15/2017   NEUTROABS 4.9 12/17/2016   HGB 13.7 03/15/2017   HCT 40.2 03/15/2017   MCV 95.5 03/15/2017   PLT 255 03/15/2017   Imaging:  [place] Clinician Interpretation: I have personally reviewed the CNS images as listed.  My interpretation, in the context of the patient's clinical presentation, is stable disease  Mr Jeri Cos Wo Contrast  Result Date: 03/22/2017 CLINICAL DATA:  Staging tumor resection site. Status post surgery yesterday. EXAM: MRI HEAD WITHOUT AND WITH CONTRAST TECHNIQUE: Multiplanar, multiecho pulse sequences of the brain and surrounding structures were obtained without and with intravenous contrast. CONTRAST:  15mL MULTIHANCE GADOBENATE DIMEGLUMINE 529 MG/ML IV SOLN COMPARISON:  03/15/2017 FINDINGS: Brain: The  essentially nonenhancing high anterior left frontal cortex space mass has been resected. No postoperative swelling or major vessel infarct. There is expected blood products around the resection cavity. There is likely pneumocephalus at the vertex. No visible residual tumor. No hydrocephalus or collection. Reactive dural thickening about the resection cavity. Vascular: Major flow voids are preserved. Skull and upper cervical spine: No evidence of marrow lesion Sinuses/Orbits: Negative IMPRESSION: Left frontal mass resection with no unexpected finding or visible residual. Electronically Signed   By: Monte Fantasia M.D.   On: 03/22/2017 11:14   Mr Jeri Cos YW Contrast  Result Date: 03/15/2017 CLINICAL DATA:  Brain tumor. EXAM: MRI HEAD WITHOUT AND WITH CONTRAST TECHNIQUE: Multiplanar, multiecho pulse sequences of the brain and surrounding structures were obtained without and with intravenous contrast. CONTRAST:  80mL MULTIHANCE GADOBENATE DIMEGLUMINE 529 MG/ML IV SOLN COMPARISON:  MRI brain 02/23/2017 and 12/17/2016. FINDINGS: Brain: A T2 hyperintense nonenhancing mass lesion in the left superior frontal gyrus anteriorly is stable, measuring 11 x 16 x 15 mm. No additional lesions are present. No acute infarct, hemorrhage, or other mass lesion is present. No significant white matter disease is present. Ventricles are of normal size. No significant extra-axial fluid collection is present. Internal  auditory canals are normal. The brainstem and cerebellum are normal. No pathologic enhancement is present. Vascular: Flow is present in the major intracranial arteries. Skull and upper cervical spine: The skull base is within normal limits. Prominent adenoid tissue is noted. This is likely within normal limits for age. The craniocervical junction is normal. Sinuses/Orbits: The paranasal sinuses and mastoid air cells are clear. Globes and orbits are within normal limits. IMPRESSION: 1. Stable T2 hyperintense nonenhancing  mass lesion in the left superior frontal gyrus as described. 2. No significant mass effect or surrounding edema. 3. No other focal lesions.  MRI of the brain is otherwise normal. Electronically Signed   By: San Morelle M.D.   On: 03/15/2017 16:53   Dg Chest Port 1 View  Result Date: 03/21/2017 CLINICAL DATA:  Central line placement, post craniotomy for tumor resection EXAM: PORTABLE CHEST 1 VIEW COMPARISON:  Portable exam 1019 hours without priors for comparison FINDINGS: RIGHT jugular line with tip projecting over cavoatrial junction. Normal heart size mediastinal contours. Diffuse BILATERAL pulmonary infiltrates which could represent edema or infection. No pleural effusion or pneumothorax. Gaseous distention of stomach. IMPRESSION: No pneumothorax following RIGHT jugular line placement. Diffuse BILATERAL pulmonary infiltrates greatest in RIGHT upper lobe question edema versus infection. Gaseous distention of stomach. Electronically Signed   By: Lavonia Dana M.D.   On: 03/21/2017 10:43     Assessment/Plan 1. Brain mass 2. Seizure [facility])  We appreciate the opportunity to participate in the care of [facility].  He is clinically and radiographically stable following surgery.  Prelim pathology suggested DNET, but tissue was sent to [facility] for formal review.  We are still pending the results of the [facility] evaluation.  He should continue Lamictal at 100mg  BID.  Keppra can be weaned to 500mg  daily for one week, then discontinued if no seizures occur.  Now almost two weeks removed from surgery, he can take 800mg  Ibuprofen as needed for severe breakthrough pain, but should stick to Tylenol for analgesia if possible.  We recommend he return to clinic in 3 months with an MRI brain for review.  All questions were answered. The patient knows to call the clinic with any problems, questions or concerns. No barriers to learning were detected.  The total time spent in the encounter was 40 minutes  and more than 50% was on counseling and review of test results   Ventura Sellers, MD Medical Director of Neuro-Oncology [facility] at [place] 03/30/17 12:17 PM

## 2017-03-31 ENCOUNTER — Telehealth: Payer: Self-pay

## 2017-03-31 NOTE — Telephone Encounter (Signed)
Called patient with upcoming appointment. Per 3/14 los mail calender and letter.

## 2017-04-03 NOTE — Addendum Note (Signed)
Addendum  created 04/03/17 2144 by Roberts Gaudy, MD   Intraprocedure Blocks edited, Sign clinical note

## 2017-04-04 ENCOUNTER — Encounter (HOSPITAL_COMMUNITY): Payer: Self-pay

## 2017-04-10 ENCOUNTER — Ambulatory Visit: Payer: Self-pay | Admitting: Internal Medicine

## 2017-06-23 ENCOUNTER — Telehealth: Payer: Self-pay | Admitting: Medical Oncology

## 2017-06-23 NOTE — Telephone Encounter (Signed)
I told caller pt has lamictal medicine at [facility].

## 2017-06-30 ENCOUNTER — Inpatient Hospital Stay: Payer: Self-pay | Attending: Internal Medicine | Admitting: Internal Medicine

## 2017-07-03 ENCOUNTER — Telehealth: Payer: Self-pay | Admitting: *Deleted

## 2017-07-03 NOTE — Telephone Encounter (Signed)
Patient missed his appt for 06/30/2017 NO SHOW, called lm for returned call to reschedule

## 2017-07-07 ENCOUNTER — Telehealth: Payer: Self-pay

## 2017-07-07 ENCOUNTER — Telehealth: Payer: Self-pay | Admitting: *Deleted
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
## 2017-07-07 NOTE — Telephone Encounter (Signed)
Faxed ROI to [facility]; release [phone]

## 2017-07-11 ENCOUNTER — Telehealth: Payer: Self-pay | Admitting: *Deleted

## 2017-07-11 NOTE — Telephone Encounter (Signed)
Marco Scott from [facility] [phone] called to advise of authorization # for MRI that is scheduled on July 9th @ 1130.  Please file to [facility] with OPIS # which will be provided at visit and DOB and Authorization # [id number] From 07/10/2017-07/10/2018  [facility] radiology pre certification department to notify.

## 2017-07-12 ENCOUNTER — Telehealth: Payer: Self-pay | Admitting: Internal Medicine

## 2017-07-12 NOTE — Telephone Encounter (Signed)
Faxed medical records to M. [facility] @ [phone]. Release VW#[id number]

## 2017-07-19 ENCOUNTER — Ambulatory Visit: Payer: Self-pay | Admitting: Internal Medicine

## 2017-07-25 ENCOUNTER — Ambulatory Visit (HOSPITAL_COMMUNITY)
Admission: RE | Admit: 2017-07-25 | Discharge: 2017-07-25 | Disposition: A | Discharge status: Home / Self Care | Payer: Medicaid Other | Source: Ambulatory Visit | Attending: Internal Medicine | Admitting: Internal Medicine

## 2017-07-25 DIAGNOSIS — D496 Neoplasm of unspecified behavior of brain: Secondary | ICD-10-CM | POA: Diagnosis present

## 2017-07-25 DIAGNOSIS — Z9889 Other specified postprocedural states: Secondary | ICD-10-CM | POA: Insufficient documentation

## 2017-07-25 MED ORDER — GADOBENATE DIMEGLUMINE 529 MG/ML IV SOLN
10.0000 mL | Freq: Once | INTRAVENOUS | Status: AC | PRN
  Administered 2017-07-25: 10 mL via INTRAVENOUS

## 2017-07-27 ENCOUNTER — Telehealth: Payer: Self-pay | Admitting: Internal Medicine

## 2017-07-27 ENCOUNTER — Encounter: Payer: Self-pay | Admitting: Internal Medicine

## 2017-07-27 ENCOUNTER — Inpatient Hospital Stay: Payer: Medicaid Other | Attending: Internal Medicine | Admitting: Internal Medicine

## 2017-07-27 VITALS — BP 110/80 | HR 70 | Temp 98.3°F | Resp 18 | Ht 68.0 in | Wt 107.9 lb

## 2017-07-27 DIAGNOSIS — R569 Unspecified convulsions: Secondary | ICD-10-CM | POA: Diagnosis not present

## 2017-07-27 DIAGNOSIS — D496 Neoplasm of unspecified behavior of brain: Secondary | ICD-10-CM | POA: Diagnosis present

## 2017-07-27 DIAGNOSIS — F17210 Nicotine dependence, cigarettes, uncomplicated: Secondary | ICD-10-CM | POA: Insufficient documentation

## 2017-07-27 DIAGNOSIS — D332 Benign neoplasm of brain, unspecified: Secondary | ICD-10-CM

## 2017-07-27 DIAGNOSIS — F1210 Cannabis abuse, uncomplicated: Secondary | ICD-10-CM | POA: Insufficient documentation

## 2017-07-27 DIAGNOSIS — Z79899 Other long term (current) drug therapy: Secondary | ICD-10-CM | POA: Diagnosis not present
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
## 2017-07-27 NOTE — Telephone Encounter (Signed)
Appointments scheduled AVS/Calendar printed per 7/11 los °

## 2017-09-07 ENCOUNTER — Encounter: Payer: Self-pay | Admitting: Internal Medicine

## 2017-10-27 ENCOUNTER — Inpatient Hospital Stay: Payer: Medicaid Other | Attending: Internal Medicine | Admitting: Internal Medicine

## 2018-01-15 ENCOUNTER — Telehealth: Payer: Self-pay | Admitting: *Deleted

## 2018-01-15 NOTE — Telephone Encounter (Signed)
Returned call to Corliss Blacker, NP [phone] or [phone] at [facility] to confirm dosage patient is to be taking for seizure mgmt.  Advised per Dr. Mickeal Skinner Lamictal 100mg  BID.  Patient is currently in their custody and unsure when court date will be and then they may be released home or sentenced to a larger facility.  Relayed message that patient is also to be scheduled for follow up his medication.  They will have their scheduling team initiate this.

## 2018-08-03 ENCOUNTER — Telehealth: Payer: Self-pay | Admitting: *Deleted

## 2018-08-03 NOTE — Telephone Encounter (Signed)
Received a message from the after hours call center stating that Mr Deboy wants to make an appt to see Dr Mickeal Skinner.  Phone numbers: [phone] or [phone]

## 2018-08-06 ENCOUNTER — Telehealth: Payer: Self-pay | Admitting: *Deleted

## 2018-08-06 NOTE — Telephone Encounter (Signed)
Patient called to get appointment scheduled.  States he has been having headaches and seizures.  He reports taking his lamictal twice daily.

## 2018-08-09 ENCOUNTER — Telehealth: Payer: Self-pay | Admitting: Internal Medicine

## 2018-08-09 ENCOUNTER — Inpatient Hospital Stay: Payer: Medicaid Other | Attending: Internal Medicine | Admitting: Internal Medicine

## 2018-08-09 ENCOUNTER — Other Ambulatory Visit: Payer: Self-pay

## 2018-08-09 VITALS — BP 101/70 | HR 69 | Temp 98.6°F | Resp 18 | Ht 68.0 in | Wt 117.6 lb

## 2018-08-09 DIAGNOSIS — R220 Localized swelling, mass and lump, head: Secondary | ICD-10-CM | POA: Insufficient documentation

## 2018-08-09 DIAGNOSIS — F17210 Nicotine dependence, cigarettes, uncomplicated: Secondary | ICD-10-CM | POA: Insufficient documentation

## 2018-08-09 DIAGNOSIS — R569 Unspecified convulsions: Secondary | ICD-10-CM

## 2018-08-09 DIAGNOSIS — Z79899 Other long term (current) drug therapy: Secondary | ICD-10-CM | POA: Diagnosis not present

## 2018-08-09 DIAGNOSIS — D332 Benign neoplasm of brain, unspecified: Secondary | ICD-10-CM

## 2018-08-09 DIAGNOSIS — G40109 Localization-related (focal) (partial) symptomatic epilepsy and epileptic syndromes with simple partial seizures, not intractable, without status epilepticus: Secondary | ICD-10-CM | POA: Diagnosis not present

## 2018-08-09 NOTE — Progress Notes (Signed)
[facility] at [place], [place] [address] [phone]   Interval Evaluation  Date of Service: 08/09/18 Patient Name: Marco Scott Patient MRN: [id number] Patient DOB: [birth year] Provider: Ventura Sellers, MD  Identifying Statement:  Marco Scott is a 23 y.o. male with left frontal brain mass   Interval History:  Marco Scott presents today for follow up one year follow up absence. He describes breakthrough seizures which occurred (at least 2) while he was in prison back in the spring time. At that time de described very poor sleep and "high stress" level due to the environment.  Since coming home, he denies any events and describes good compliance with Lamictal 100mg  twice per day.  In addition, he has experienced modest increase in frequency of migraine headaches.  Has been taking ibuprofen, although only once pain becomes severe and intolerable.  Medications: Current Outpatient Medications on File Prior to Visit  Medication Sig Dispense Refill  . ibuprofen (ADVIL,MOTRIN) 800 MG tablet Take 1 tablet (800 mg total) by mouth every 8 (eight) hours as needed (for severe pain 8-10). 30 tablet 2  . lamoTRIgine (LAMICTAL) 100 MG tablet Take 1 tablet (100 mg total) by mouth 2 (two) times daily. (Patient not taking: Reported on 07/27/2017) 60 tablet 3  . levETIRAcetam (KEPPRA) 500 MG tablet Take 1 tablet (500 mg total) by mouth 2 (two) times daily. 180 tablet 0   No current facility-administered medications on file prior to visit.     Allergies:  Allergies  Allergen Reactions  . Gadolinium Derivatives Nausea And Vomiting   Past Medical History:  Past Medical History:  Diagnosis Date  . Brain tumor ([place]) 03/21/2017  . Seizure ([place])    FOR LAST 4 MONTHS   Past Surgical History:  Past Surgical History:  Procedure Laterality Date  . APPLICATION OF CRANIAL NAVIGATION N/A 03/21/2017   Procedure: APPLICATION OF CRANIAL NAVIGATION;   Surgeon: Erline Levine, MD;  Location: [facility];  Service: Neurosurgery;  Laterality: N/A;  . CRANIOTOMY Left 03/21/2017   Procedure: Left frontal craniotomy for brain tumor with Brainlab;  Surgeon: Erline Levine, MD;  Location: [facility];  Service: Neurosurgery;  Laterality: Left;  left frontal  . TOOTH EXTRACTION     Social History:  Social History   Socioeconomic History  . Marital status: Single    Spouse name: Not on file  . Number of children: Not on file  . Years of education: Not on file  . Highest education level: Not on file  Occupational History  . Not on file  Social Needs  . Financial resource strain: Somewhat hard  . Food insecurity    Worry: Patient refused    Inability: Patient refused  . Transportation needs    Medical: Patient refused    Non-medical: Patient refused  Tobacco Use  . Smoking status: Current Some Day Smoker    Types: Cigarettes  . Smokeless tobacco: Never Used  Substance and Sexual Activity  . Alcohol use: No    Frequency: Never  . Drug use: Yes    Types: Marijuana  . Sexual activity: Not Currently  Lifestyle  . Physical activity    Days per week: Not on file    Minutes per session: Not on file  . Stress: Not on file  Relationships  . Social Herbalist on phone: Not on file    Gets together: Not on file    Attends religious service: Not on  file    Active member of club or organization: Not on file    Attends meetings of clubs or organizations: Not on file    Relationship status: Not on file  . Intimate partner violence    Fear of current or ex partner: Not on file    Emotionally abused: Not on file    Physically abused: Not on file    Forced sexual activity: Not on file  Other Topics Concern  . Not on file  Social History Narrative   Currently on house arrest and wearing ankle monitor   Family History: No family history on file.  Review of Systems: Constitutional: Denies fevers, chills or abnormal weight loss Eyes: Denies  blurriness of vision Ears, nose, mouth, throat, and face: Denies mucositis or sore throat Respiratory: Denies cough, dyspnea or wheezes Cardiovascular: Denies palpitation, chest discomfort or lower extremity swelling Gastrointestinal:  Denies nausea, constipation, diarrhea GU: Denies dysuria or incontinence Skin: Denies abnormal skin rashes Neurological: Per HPI Musculoskeletal: Denies joint pain, back or neck discomfort. No decrease in ROM Behavioral/Psych: Denies anxiety, disturbance in thought content, and mood instability  Physical Exam: Vitals:   08/09/18 1039  BP: 101/70  Pulse: 69  Resp: 18  Temp: 98.6 F (37 C)  SpO2: 100%   KPS: 100. General: Alert, cooperative, pleasant, in no acute distress Head: Craniotomy scar, C/D/I EENT: No conjunctival injection or scleral icterus. Oral mucosa moist Lungs: Resp effort normal Cardiac: Regular rate and rhythm Abdomen: Soft, non-distended abdomen Skin: No rashes cyanosis or petechiae. Extremities: No clubbing or edema  Neurologic Exam: Mental Status: Awake, alert, attentive to examiner. Oriented to self and environment. Language is fluent with intact comprehension.  Cranial Nerves: Visual acuity is grossly normal. Visual fields are full. Extra-ocular movements intact. No ptosis. Face is symmetric, tongue midline. Motor: Tone and bulk are normal. Power is full in both arms and legs. Reflexes are symmetric, no pathologic reflexes present. Intact finger to nose bilaterally Sensory: Intact to light touch and temperature Gait: Normal and tandem gait is normal.   Labs: I have reviewed the data as listed    Component Value Date/Time   NA 136 03/15/2017 1317   K 3.7 03/15/2017 1317   CL 103 03/15/2017 1317   CO2 23 03/15/2017 1317   GLUCOSE 78 03/15/2017 1317   BUN 10 03/15/2017 1317   CREATININE 0.92 03/15/2017 1317   CALCIUM 9.5 03/15/2017 1317   PROT 6.8 12/17/2016 1552   ALBUMIN 3.8 12/17/2016 1552   AST 26 12/17/2016  1552   ALT 12 (L) 12/17/2016 1552   ALKPHOS 59 12/17/2016 1552   BILITOT 0.7 12/17/2016 1552   GFRNONAA >60 03/15/2017 1317   GFRAA >60 03/15/2017 1317   Lab Results  Component Value Date   WBC 8.0 03/15/2017   NEUTROABS 4.9 12/17/2016   HGB 13.7 03/15/2017   HCT 40.2 03/15/2017   MCV 95.5 03/15/2017   PLT 255 03/15/2017    Assessment/Plan 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  He has experienced breakthrough seizures in the context of stress and sleep deprivation surrounding his imprisonment.  Since return to the community his seizures have been well controlled.  We recommend her resume Lamictal 100mg  BID for the time being.    For migraines, we recommended utilizing Ibuprofen 800mg  PO at earliest sign of a migraine headache, not to exceed dosing 4x per week.  If migraines are persistent could consider addition of amytryptiline for  prophylaxis.  Additionally, he is due for MRI to evaluate left frontal DNET.    We appreciate the opportunity to participate in the care of [facility].    We recommend he return to clinic in 4 months for seizure and headache evaluation.  We will review MRI results with him over the phone.  All questions were answered. The patient knows to call the clinic with any problems, questions or concerns. No barriers to learning were detected.  The total time spent in the encounter was 40 minutes and more than 50% was on counseling and review of test results   Ventura Sellers, MD Medical Director of Neuro-Oncology [facility] 08/09/18 10:35 AM

## 2018-08-09 NOTE — Telephone Encounter (Signed)
Scheduled appt per 7/23 los.  Spoke with patient and he is aware of his appt date and time.

## 2018-08-15 ENCOUNTER — Encounter: Payer: Self-pay | Admitting: Internal Medicine

## 2018-08-15 ENCOUNTER — Ambulatory Visit (HOSPITAL_COMMUNITY)
Admission: RE | Admit: 2018-08-15 | Discharge: 2018-08-15 | Disposition: A | Discharge status: Home / Self Care | Payer: Medicaid Other | Source: Ambulatory Visit | Attending: Internal Medicine | Admitting: Internal Medicine

## 2018-08-15 ENCOUNTER — Other Ambulatory Visit: Payer: Self-pay | Admitting: Radiation Therapy

## 2018-08-15 ENCOUNTER — Other Ambulatory Visit: Payer: Self-pay

## 2018-08-15 DIAGNOSIS — R569 Unspecified convulsions: Secondary | ICD-10-CM | POA: Diagnosis present

## 2018-08-15 DIAGNOSIS — D332 Benign neoplasm of brain, unspecified: Secondary | ICD-10-CM | POA: Diagnosis present

## 2018-08-15 MED ORDER — GADOBUTROL 1 MMOL/ML IV SOLN
5.0000 mL | Freq: Once | INTRAVENOUS | Status: AC | PRN
  Administered 2018-08-15: 18:00:00 5 mL via INTRAVENOUS

## 2018-08-16 ENCOUNTER — Other Ambulatory Visit: Payer: Self-pay | Admitting: Internal Medicine

## 2018-08-16 ENCOUNTER — Encounter: Payer: Self-pay | Admitting: Internal Medicine

## 2018-08-16 ENCOUNTER — Encounter: Payer: Self-pay | Admitting: *Deleted

## 2018-08-16 MED ORDER — LAMOTRIGINE 100 MG PO TABS: 100.0000 mg | ORAL_TABLET | Freq: Two times a day (BID) | ORAL | 3 refills | Status: DC

## 2018-09-10 ENCOUNTER — Encounter: Payer: Self-pay | Admitting: Internal Medicine

## 2018-09-12 ENCOUNTER — Telehealth: Payer: Self-pay | Admitting: Internal Medicine

## 2018-09-13 ENCOUNTER — Inpatient Hospital Stay: Payer: Self-pay | Attending: Internal Medicine | Admitting: Internal Medicine

## 2018-09-13 DIAGNOSIS — F17210 Nicotine dependence, cigarettes, uncomplicated: Secondary | ICD-10-CM | POA: Insufficient documentation

## 2018-09-13 DIAGNOSIS — D332 Benign neoplasm of brain, unspecified: Secondary | ICD-10-CM | POA: Insufficient documentation

## 2018-09-13 DIAGNOSIS — R51 Headache: Secondary | ICD-10-CM | POA: Insufficient documentation

## 2018-09-13 DIAGNOSIS — G40109 Localization-related (focal) (partial) symptomatic epilepsy and epileptic syndromes with simple partial seizures, not intractable, without status epilepticus: Secondary | ICD-10-CM | POA: Insufficient documentation

## 2018-09-13 NOTE — Progress Notes (Signed)
I connected with Marco Scott on 09/13/18 at 11:00 AM EDT by telephone visit and verified that I am speaking with the correct person using two identifiers.  I discussed the limitations, risks, security and privacy concerns of performing an evaluation and management service by telemedicine and the availability of in-person appointments. I also discussed with the patient that there may be a patient responsible charge related to this service. The patient expressed understanding and agreed to proceed.  Other persons participating in the visit and their role in the encounter:  n/a  Patient's location:  Home  Provider's location:  Office  Chief Complaint:  DNET, focal seizures  History of Present Ilness: No seizure events since our last visit.  Continues to take Lamictal without issue.  No new or progressive neurologic deficits. Observations: Language and cognition intact  Imaging:  [facility] Clinician Interpretation: I have personally reviewed the CNS images as listed.  My interpretation, in the context of the patient's clinical presentation, is stable disease  Marco Scott Wo Contrast  Result Date: 08/16/2018 CLINICAL DATA:  23 year old male status post surgical resection in March 2019 of WHO grade 1 DNET/oligodendroglial-like neoplasm. Occasional breakthrough seizures. Headaches. EXAM: MRI HEAD WITHOUT AND WITH CONTRAST TECHNIQUE: Multiplanar, multiecho pulse sequences of the brain and surrounding structures were obtained without and with intravenous contrast. CONTRAST:  5 milliliters Gadavist 07/25/2017 and earlier. COMPARISON:  None. FINDINGS: Brain: A small resection cavity with mild regional postoperative architectural distortion is redemonstrated in the anterior left superior frontal gyrus on series 6, image 20 and series 10, image 23. There is minimal T2 and FLAIR hyperintensity at the margins. No mass effect. No new signal abnormality. There is a small 7-8 millimeter nodular, somewhat curvilinear area  of enhancement within the central aspect of the cavity which is stable on series 11, image 122 and series 12, image 23. No new enhancement or signal changes.  No abnormal diffusion. Elsewhere stable and normal for age gray and white matter signal throughout the brain. No restricted diffusion to suggest acute infarction. No midline shift, mass effect, ventriculomegaly, or acute intracranial hemorrhage. Cervicomedullary junction and pituitary are within normal limits. No dural thickening. Vascular: Major intracranial vascular flow voids are stable. The major dural venous sinuses are enhancing and appear to be patent. Skull and upper cervical spine: Mild left frontal convexity craniotomy changes. Normal bone marrow signal. Negative visible cervical spine. Sinuses/Orbits: Stable and negative. Other: Mastoids remain clear. Visible internal auditory structures appear normal. There is a small chronic dermal cyst along the left zygoma which is stable on series 5, image 5. Otherwise negative visible face and scalp soft tissues. IMPRESSION: 1. Satisfactory postoperative appearance of the brain: the small anterior resection cavity at the left superior frontal gyrus is stable since July 2019 with a small internal nodular area of enhancement that may be vascular related. 2. No new intracranial abnormality. Electronically Signed   By: Genevie Ann M.D.   On: 08/16/2018 14:40    Assessment and Plan: Stable DNET, focal seizures well controlled on Lamictal monotherapy Follow Up Instructions: Follow up in clinic as scheduled in 3 months.  Next MRI in 1 year.  I discussed the assessment and treatment plan with the patient.  The patient was provided an opportunity to ask questions and all were answered.  The patient agreed with the plan and demonstrated understanding of the instructions.    The patient was advised to call back or seek an in-person evaluation if the symptoms worsen or if the  condition fails to improve as anticipated.   I provided 5-10 minutes of non-face-to-face time during this enocunter.  Ventura Sellers, MD   I provided 10 minutes of non face-to-face telephone visit time during this encounter, and > 50% was spent counseling as documented under my assessment & plan.

## 2018-09-14 ENCOUNTER — Telehealth: Payer: Self-pay | Admitting: Internal Medicine

## 2018-09-14 NOTE — Telephone Encounter (Signed)
No new los, referrals or active request during time of check out.

## 2018-12-04 ENCOUNTER — Telehealth: Payer: Self-pay | Admitting: Internal Medicine

## 2018-12-04 ENCOUNTER — Inpatient Hospital Stay: Payer: Medicaid Other | Attending: Internal Medicine | Admitting: Internal Medicine

## 2018-12-04 ENCOUNTER — Other Ambulatory Visit: Payer: Self-pay

## 2018-12-04 VITALS — BP 106/74 | HR 76 | Temp 97.9°F | Resp 17 | Ht 68.0 in | Wt 106.6 lb

## 2018-12-04 DIAGNOSIS — Z791 Long term (current) use of non-steroidal anti-inflammatories (NSAID): Secondary | ICD-10-CM | POA: Insufficient documentation

## 2018-12-04 DIAGNOSIS — D332 Benign neoplasm of brain, unspecified: Secondary | ICD-10-CM | POA: Diagnosis present

## 2018-12-04 DIAGNOSIS — Z79899 Other long term (current) drug therapy: Secondary | ICD-10-CM | POA: Diagnosis not present

## 2018-12-04 DIAGNOSIS — F17210 Nicotine dependence, cigarettes, uncomplicated: Secondary | ICD-10-CM | POA: Diagnosis not present

## 2018-12-04 DIAGNOSIS — G40109 Localization-related (focal) (partial) symptomatic epilepsy and epileptic syndromes with simple partial seizures, not intractable, without status epilepticus: Secondary | ICD-10-CM | POA: Insufficient documentation

## 2018-12-04 DIAGNOSIS — R569 Unspecified convulsions: Secondary | ICD-10-CM

## 2018-12-04 MED ORDER — LAMOTRIGINE 100 MG PO TABS: 100.0000 mg | ORAL_TABLET | Freq: Two times a day (BID) | ORAL | 3 refills | Status: DC

## 2018-12-04 NOTE — Telephone Encounter (Signed)
Scheduled appt per 11/17 los.,  Pt declined calendar and avs.

## 2018-12-04 NOTE — Progress Notes (Signed)
[facility] at [place], [place] [address] [phone]   Interval Evaluation  Date of Service: 12/04/18 Patient Name: Marco Scott Patient MRN: [id number] Patient DOB: [birth year] Provider: Ventura Sellers, MD  Identifying Statement:  Marco Scott is a 23 y.o. male with left frontal DNET   Interval History:  Marco Scott presents today for follow up. He describes no recent breakthrough seizures.  Continues to take Lamictal twice per day without complication.  Still looking for work and fighting through legal issues.    Medications: Current Outpatient Medications on File Prior to Visit  Medication Sig Dispense Refill  . ibuprofen (ADVIL,MOTRIN) 800 MG tablet Take 1 tablet (800 mg total) by mouth every 8 (eight) hours as needed (for severe pain 8-10). 30 tablet 2  . lamoTRIgine (LAMICTAL) 100 MG tablet Take 1 tablet (100 mg total) by mouth 2 (two) times daily. 60 tablet 3   No current facility-administered medications on file prior to visit.     Allergies:  Allergies  Allergen Reactions  . Gadolinium Derivatives Nausea And Vomiting   Past Medical History:  Past Medical History:  Diagnosis Date  . Brain tumor ([place]) 03/21/2017  . Seizure ([place])    FOR LAST 4 MONTHS   Past Surgical History:  Past Surgical History:  Procedure Laterality Date  . APPLICATION OF CRANIAL NAVIGATION N/A 03/21/2017   Procedure: APPLICATION OF CRANIAL NAVIGATION;  Surgeon: Erline Levine, MD;  Location: [facility];  Service: Neurosurgery;  Laterality: N/A;  . CRANIOTOMY Left 03/21/2017   Procedure: Left frontal craniotomy for brain tumor with Brainlab;  Surgeon: Erline Levine, MD;  Location: [facility];  Service: Neurosurgery;  Laterality: Left;  left frontal  . TOOTH EXTRACTION     Social History:  Social History   Socioeconomic History  . Marital status: Single    Spouse name: Not on file  . Number of children: Not on file  . Years of education: Not on  file  . Highest education level: Not on file  Occupational History  . Not on file  Social Needs  . Financial resource strain: Somewhat hard  . Food insecurity    Worry: Patient refused    Inability: Patient refused  . Transportation needs    Medical: Patient refused    Non-medical: Patient refused  Tobacco Use  . Smoking status: Current Some Day Smoker    Types: Cigarettes  . Smokeless tobacco: Never Used  Substance and Sexual Activity  . Alcohol use: No    Frequency: Never  . Drug use: Yes    Types: Marijuana  . Sexual activity: Not Currently  Lifestyle  . Physical activity    Days per week: Not on file    Minutes per session: Not on file  . Stress: Not on file  Relationships  . Social Herbalist on phone: Not on file    Gets together: Not on file    Attends religious service: Not on file    Active member of club or organization: Not on file    Attends meetings of clubs or organizations: Not on file    Relationship status: Not on file  . Intimate partner violence    Fear of current or ex partner: Not on file    Emotionally abused: Not on file    Physically abused: Not on file    Forced sexual activity: Not on file  Other Topics Concern  . Not on file  Social History Narrative   Currently on house arrest and wearing ankle monitor   Family History: No family history on file.  Review of Systems: Constitutional: Denies fevers, chills or abnormal weight loss Eyes: Denies blurriness of vision Ears, nose, mouth, throat, and face: Denies mucositis or sore throat Respiratory: Denies cough, dyspnea or wheezes Cardiovascular: Denies palpitation, chest discomfort or lower extremity swelling Gastrointestinal:  Denies nausea, constipation, diarrhea GU: Denies dysuria or incontinence Skin: Denies abnormal skin rashes Neurological: Per HPI Musculoskeletal: Denies joint pain, back or neck discomfort. No decrease in ROM Behavioral/Psych: Denies anxiety, disturbance  in thought content, and mood instability  Physical Exam: Vitals:   12/04/18 0859  BP: 106/74  Pulse: 76  Resp: 17  Temp: 97.9 F (36.6 C)  SpO2: 100%   KPS: 100. General: Alert, cooperative, pleasant, in no acute distress Head: Craniotomy scar, C/D/I EENT: No conjunctival injection or scleral icterus. Oral mucosa moist Lungs: Resp effort normal Cardiac: Regular rate and rhythm Abdomen: Soft, non-distended abdomen Skin: No rashes cyanosis or petechiae. Extremities: No clubbing or edema  Neurologic Exam: Mental Status: Awake, alert, attentive to examiner. Oriented to self and environment. Language is fluent with intact comprehension.  Cranial Nerves: Visual acuity is grossly normal. Visual fields are full. Extra-ocular movements intact. No ptosis. Face is symmetric, tongue midline. Motor: Tone and bulk are normal. Power is full in both arms and legs. Reflexes are symmetric, no pathologic reflexes present. Intact finger to nose bilaterally Sensory: Intact to light touch and temperature Gait: Normal and tandem gait is normal.   Labs: I have reviewed the data as listed    Component Value Date/Time   NA 136 03/15/2017 1317   K 3.7 03/15/2017 1317   CL 103 03/15/2017 1317   CO2 23 03/15/2017 1317   GLUCOSE 78 03/15/2017 1317   BUN 10 03/15/2017 1317   CREATININE 0.92 03/15/2017 1317   CALCIUM 9.5 03/15/2017 1317   PROT 6.8 12/17/2016 1552   ALBUMIN 3.8 12/17/2016 1552   AST 26 12/17/2016 1552   ALT 12 (L) 12/17/2016 1552   ALKPHOS 59 12/17/2016 1552   BILITOT 0.7 12/17/2016 1552   GFRNONAA >60 03/15/2017 1317   GFRAA >60 03/15/2017 1317   Lab Results  Component Value Date   WBC 8.0 03/15/2017   NEUTROABS 4.9 12/17/2016   HGB 13.7 03/15/2017   HCT 40.2 03/15/2017   MCV 95.5 03/15/2017   PLT 255 03/15/2017    Assessment/Plan 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    We  recommended he resume Lamictal 100mg  BID and have provided refills.     We appreciate the opportunity to participate in the care of [facility].    We recommend he return to clinic in 4-5 months for seizure and headache follow up.  Next brain MRI should be in ~August 2021.  All questions were answered. The patient knows to call the clinic with any problems, questions or concerns. No barriers to learning were detected.  The total time spent in the encounter was 25 minutes and more than 50% was on counseling and review of test results   Ventura Sellers, MD Medical Director of Neuro-Oncology [facility] [place] 12/04/18 9:00 AM

## 2019-02-01 ENCOUNTER — Other Ambulatory Visit: Payer: Self-pay | Admitting: Critical Care Medicine

## 2019-02-01 DIAGNOSIS — Z20822 Contact with and (suspected) exposure to covid-19: Secondary | ICD-10-CM

## 2019-02-02 LAB — NOVEL CORONAVIRUS, NAA: SARS-CoV-2, NAA: NOT DETECTED

## 2019-04-02 ENCOUNTER — Inpatient Hospital Stay: Payer: Medicaid Other | Attending: Internal Medicine | Admitting: Internal Medicine

## 2019-04-21 IMAGING — DX DG CHEST 1V PORT
1 series · 1 of 1 positions shown · non-contrast
Comparison: Portable exam 0509 hours without priors for comparison

CLINICAL DATA: Central line placement, post craniotomy for tumor
resection

EXAM:
PORTABLE CHEST 1 VIEW

[chest ap]
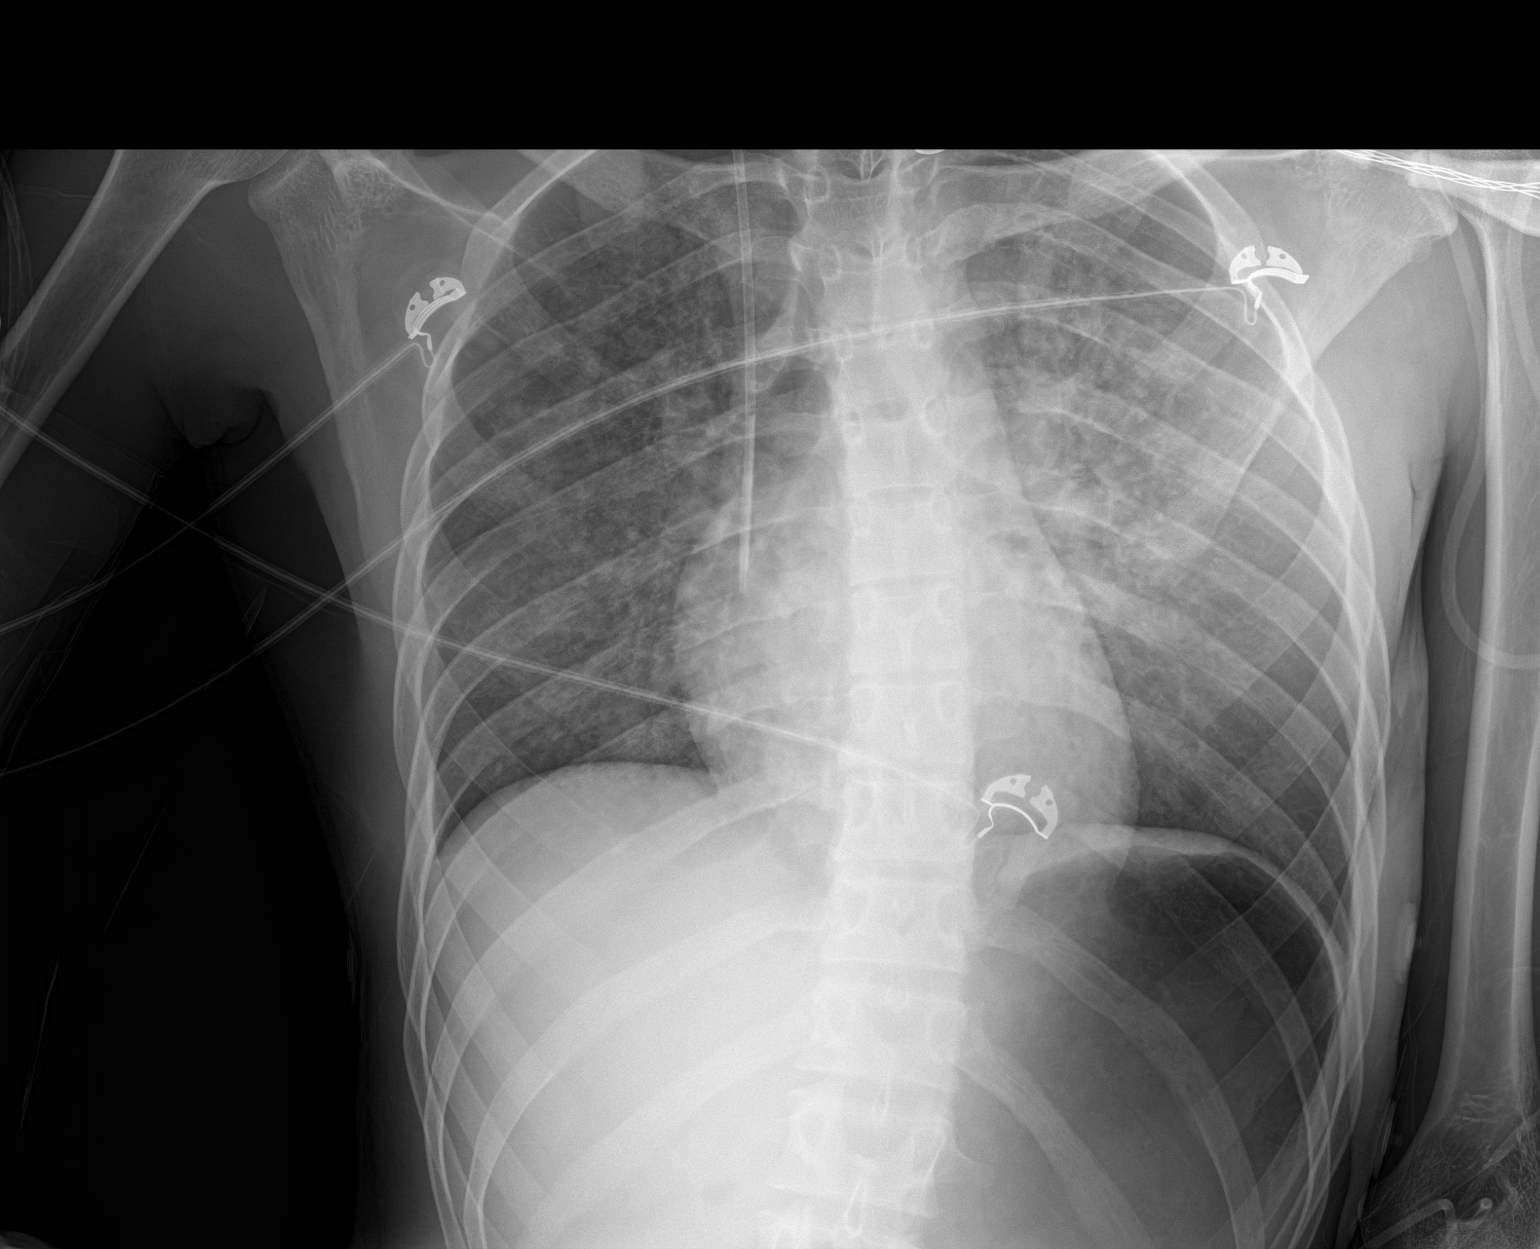

[1 of 1 positions shown; findings below may reference images not displayed]

FINDINGS: RIGHT jugular line with tip projecting over cavoatrial junction.

Normal heart size mediastinal contours.

Diffuse BILATERAL pulmonary infiltrates which could represent edema
or infection.

No pleural effusion or pneumothorax.

Gaseous distention of stomach.
IMPRESSION: No pneumothorax following RIGHT jugular line placement.

Diffuse BILATERAL pulmonary infiltrates greatest in RIGHT upper lobe
question edema versus infection.

Gaseous distention of stomach.

## 2019-04-22 IMAGING — MR MR HEAD WO/W CM
13 of 14 series · 37 of 48 positions shown · IV contrast (10 multihance)
Comparison: 03/15/2017

CLINICAL DATA: Staging tumor resection site. Status post surgery
yesterday.

EXAM:
MRI HEAD WITHOUT AND WITH CONTRAST
TECHNIQUE: Multiplanar, multiecho pulse sequences of the brain and surrounding
structures were obtained without and with intravenous contrast.
CONTRAST:  10mL MULTIHANCE GADOBENATE DIMEGLUMINE 529 MG/ML IV SOLN

[Series 3: DWI · axial · 3.0mm · 0.94mm/px · z∈[-33,+114]mm · 6 of 100 slices shown (1 of 2)]
[im 1/100]
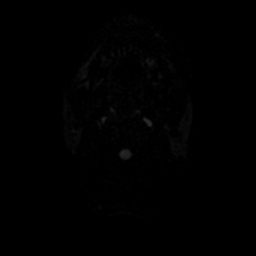
[im 20/100]
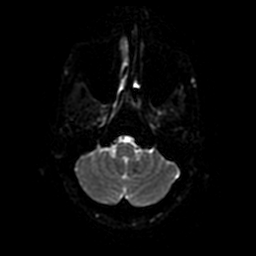
[im 40/100]
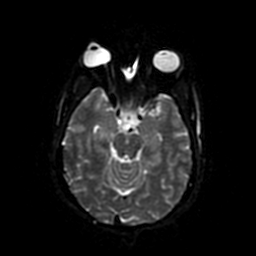
[im 60/100]
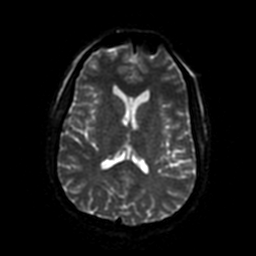
[im 80/100]
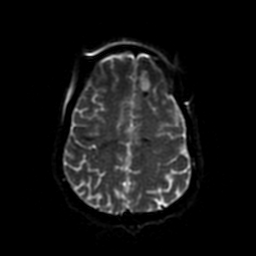
[im 100/100]
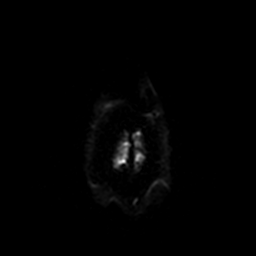

[Series 4: DWI · coronal · 4.0mm · 0.94mm/px · 4 of 72 slices shown (2 of 2)]
[im 1/72]
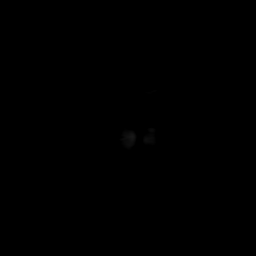
[im 24/72]
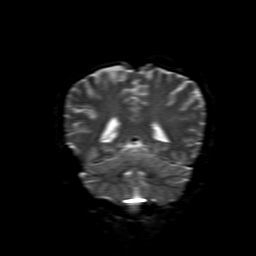
[im 48/72]
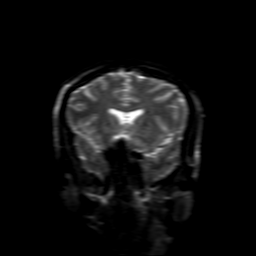
[im 72/72]
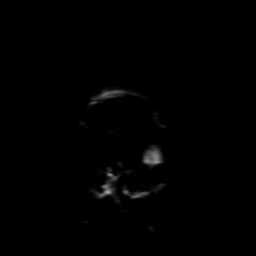

[Series 5: FLAIR · sagittal · 5.0mm · 0.47mm/px · 1 of 23 slices shown (1 of 2)]
[im 1/23]
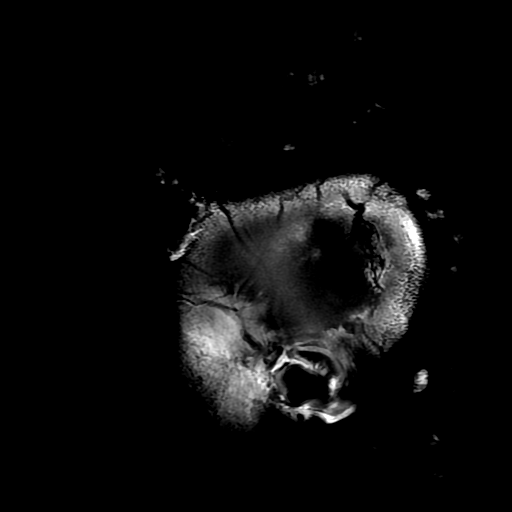

[Series 6: T2 · axial · 5.0mm · 0.47mm/px · 1 of 26 slices shown (1 of 2)]
[im 1/26]
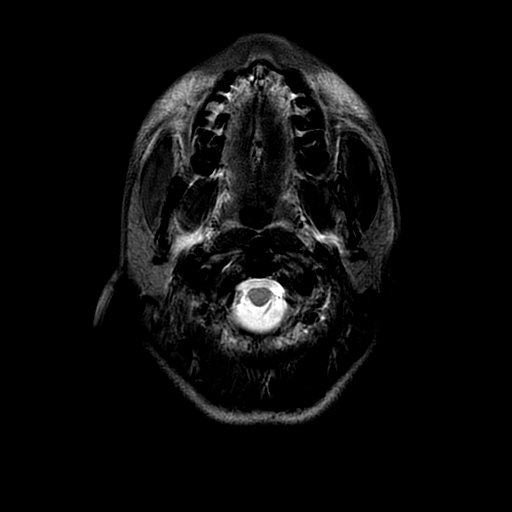

[Series 7: FLAIR · axial · 5.0mm · 0.47mm/px · 1 of 26 slices shown (2 of 2)]
[im 1/26]
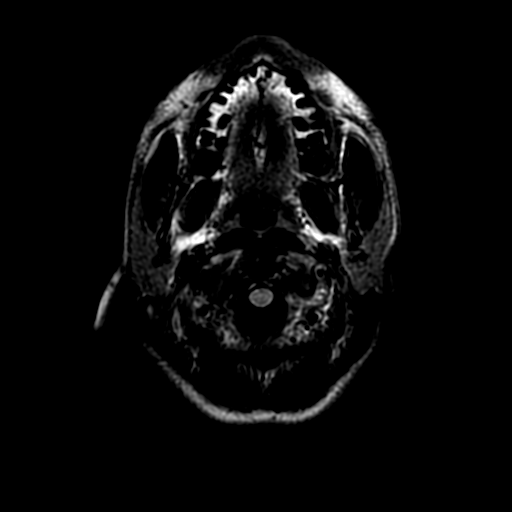

[Series 8: (person_name) · axial · 3.0mm · 0.47mm/px · z∈[-30,+118]mm · 5 of 100 slices shown]
[im 1/100]
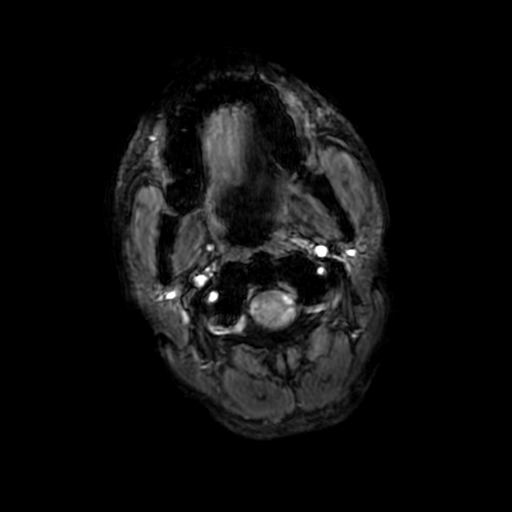
[im 25/100]
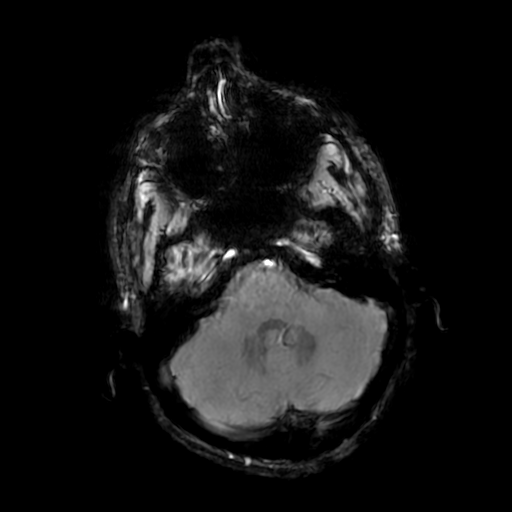
[im 50/100]
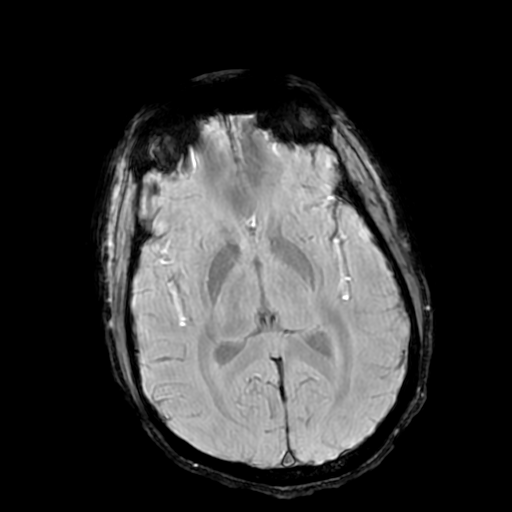
[im 75/100]
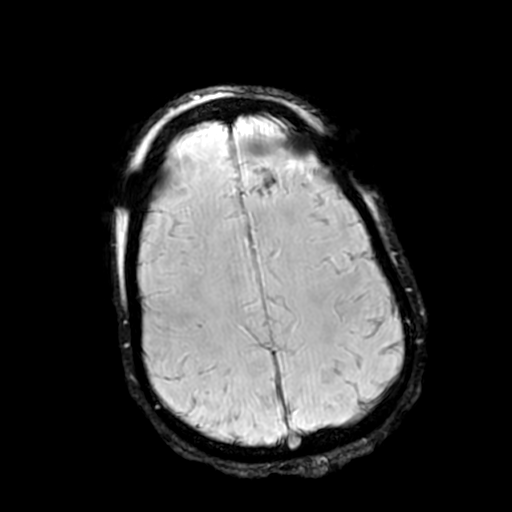
[im 100/100]
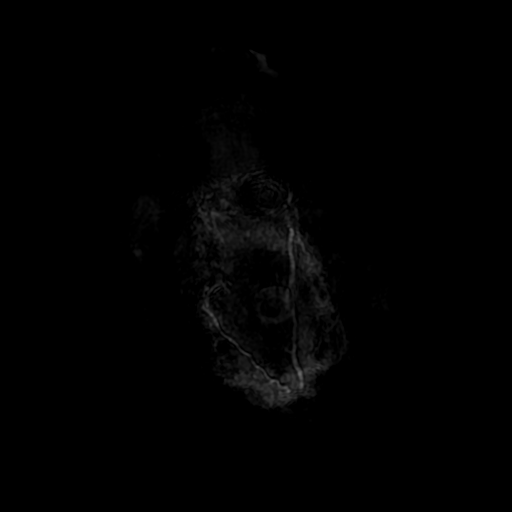

[Series 9: ax 3(person_name) · axial · 1.0mm · 0.94mm/px · z∈[-37,+122]mm · 8 of 160 slices shown (1 of 2)]
[im 1/160]
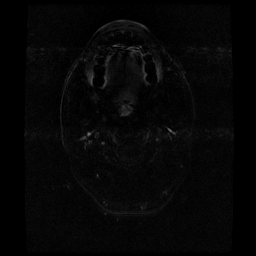
[im 23/160]
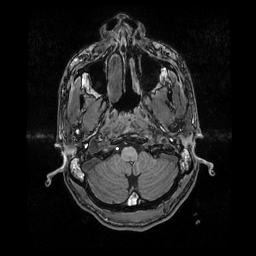
[im 46/160]
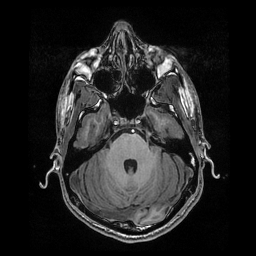
[im 69/160]
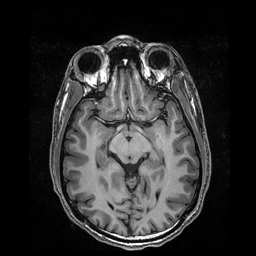
[im 91/160]
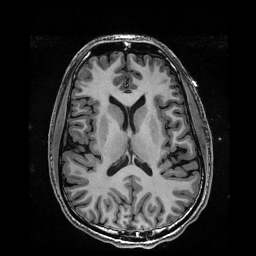
[im 114/160]
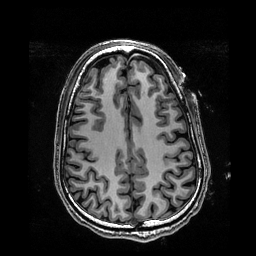
[im 137/160]
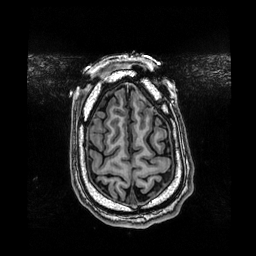
[im 160/160]
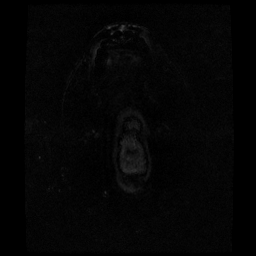

[Series 10: T2 · coronal · 5.0mm · 0.47mm/px · 2 of 30 slices shown (2 of 2)]
[im 1/30]
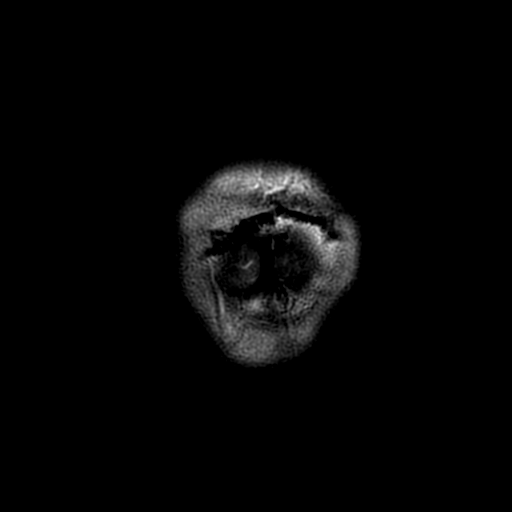
[im 30/30]
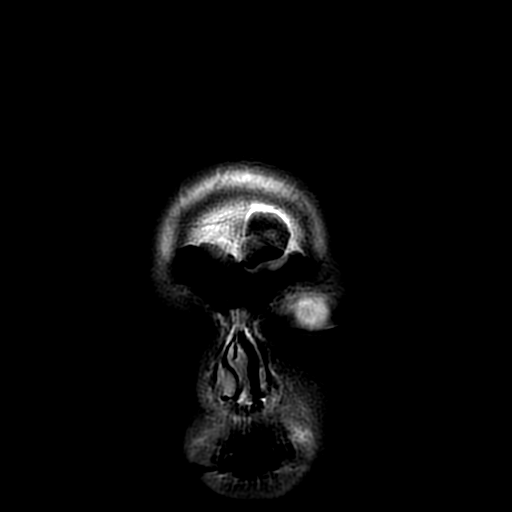

[Series 12: ax 3(person_name) · axial · 1.0mm · 0.94mm/px · z∈[-30,-8]mm · 2 of 160 slices shown (2 of 2)]
[im 1/160]
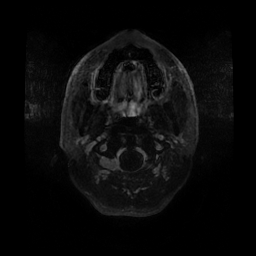
[im 23/160]
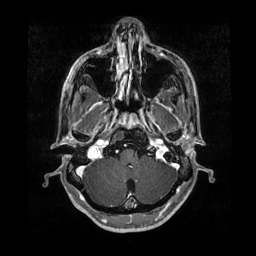

[Series 13: T1 · coronal · 5.0mm · 0.43mm/px · 1 of 29 slices shown (1 of 2)]
[im 1/29]
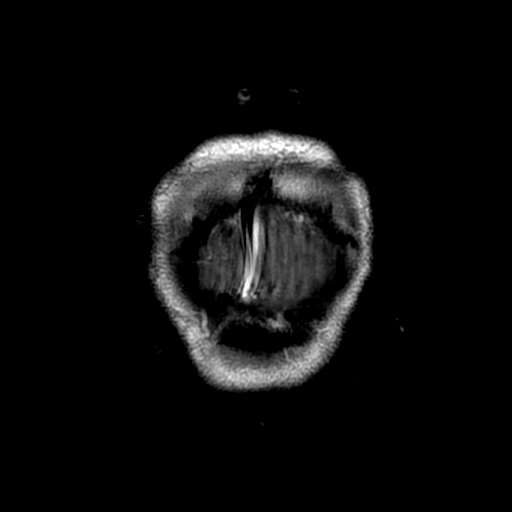

[Series 14: T1 · sagittal · 5.0mm · 0.43mm/px · 1 of 22 slices shown (2 of 2)]
[im 1/22]
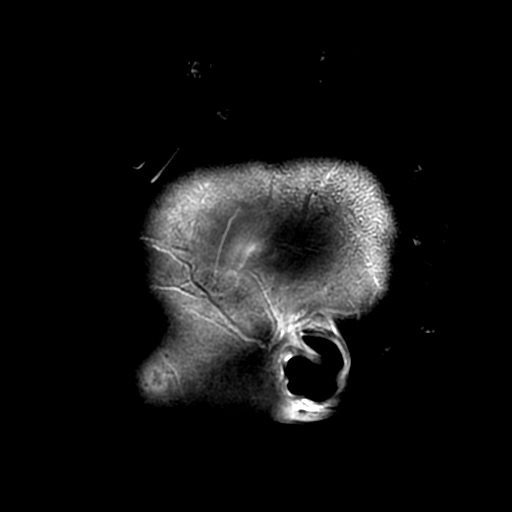

[Series 350: ADC · axial · 3.0mm · 0.94mm/px · z∈[-33,+114]mm · 3 of 50 slices shown (1 of 2)]
[im 1/50]
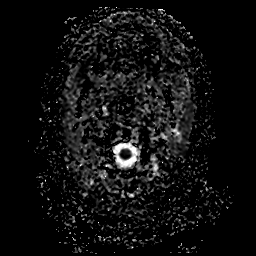
[im 25/50]
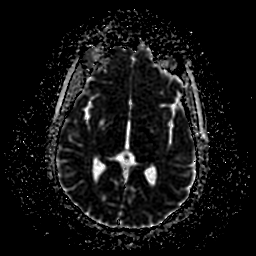
[im 50/50]
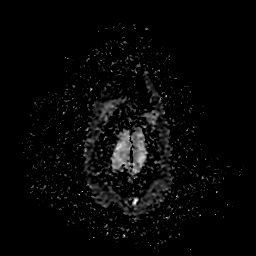

[Series 450: ADC · coronal · 4.0mm · 0.94mm/px · 2 of 36 slices shown (2 of 2)]
[im 1/36]
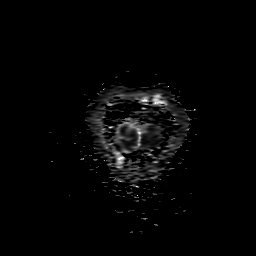
[im 36/36]
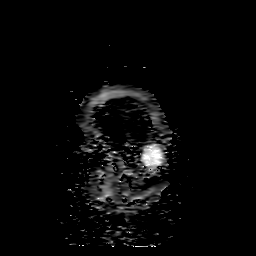

[37 of 48 positions shown; findings below may reference images not displayed]

FINDINGS: Brain: The essentially nonenhancing high anterior left frontal
cortex space mass has been resected. No postoperative swelling or
major vessel infarct. There is expected blood products around the
resection cavity. There is likely pneumocephalus at the vertex. No
visible residual tumor. No hydrocephalus or collection. Reactive
dural thickening about the resection cavity.

Vascular: Major flow voids are preserved.

Skull and upper cervical spine: No evidence of marrow lesion

Sinuses/Orbits: Negative
IMPRESSION: Left frontal mass resection with no unexpected finding or visible
residual.

## 2019-05-31 ENCOUNTER — Emergency Department (HOSPITAL_COMMUNITY): Payer: Medicaid Other

## 2019-05-31 ENCOUNTER — Emergency Department (HOSPITAL_COMMUNITY)
Admission: EM | Admit: 2019-05-31 | Discharge: 2019-05-31 | Disposition: A | Discharge status: Home / Self Care | Payer: Medicaid Other | Attending: Emergency Medicine | Admitting: Emergency Medicine

## 2019-05-31 ENCOUNTER — Other Ambulatory Visit: Payer: Self-pay

## 2019-05-31 ENCOUNTER — Encounter (HOSPITAL_COMMUNITY): Payer: Self-pay | Admitting: Emergency Medicine

## 2019-05-31 DIAGNOSIS — Z79899 Other long term (current) drug therapy: Secondary | ICD-10-CM | POA: Insufficient documentation

## 2019-05-31 DIAGNOSIS — F17210 Nicotine dependence, cigarettes, uncomplicated: Secondary | ICD-10-CM | POA: Diagnosis not present

## 2019-05-31 DIAGNOSIS — T424X4A Poisoning by benzodiazepines, undetermined, initial encounter: Secondary | ICD-10-CM | POA: Insufficient documentation

## 2019-05-31 LAB — RAPID URINE DRUG SCREEN, HOSP PERFORMED
Amphetamines: NOT DETECTED
Barbiturates: NOT DETECTED
Benzodiazepines: POSITIVE — AB
Cocaine: NOT DETECTED
Opiates: NOT DETECTED
Tetrahydrocannabinol: POSITIVE — AB

## 2019-05-31 LAB — CBC
HCT: 39.7 % (ref 39.0–52.0)
Hemoglobin: 12.9 g/dL — ABNORMAL LOW (ref 13.0–17.0)
MCH: 32.6 pg (ref 26.0–34.0)
MCHC: 32.5 g/dL (ref 30.0–36.0)
MCV: 100.3 fL — ABNORMAL HIGH (ref 80.0–100.0)
Platelets: 267 10*3/uL (ref 150–400)
RBC: 3.96 MIL/uL — ABNORMAL LOW (ref 4.22–5.81)
RDW: 12.1 % (ref 11.5–15.5)
WBC: 7.5 10*3/uL (ref 4.0–10.5)
nRBC: 0 % (ref 0.0–0.2)

## 2019-05-31 LAB — COMPREHENSIVE METABOLIC PANEL
ALT: 12 U/L (ref 0–44)
AST: 19 U/L (ref 15–41)
Albumin: 3.9 g/dL (ref 3.5–5.0)
Alkaline Phosphatase: 48 U/L (ref 38–126)
Anion gap: 9 (ref 5–15)
BUN: 7 mg/dL (ref 6–20)
CO2: 25 mmol/L (ref 22–32)
Calcium: 9.3 mg/dL (ref 8.9–10.3)
Chloride: 106 mmol/L (ref 98–111)
Creatinine, Ser: 0.88 mg/dL (ref 0.61–1.24)
GFR calc Af Amer: >60 mL/min (ref >60)
GFR calc non Af Amer: >60 mL/min (ref >60)
Glucose, Bld: 102 mg/dL — ABNORMAL HIGH (ref 70–99)
Potassium: 4.2 mmol/L (ref 3.5–5.1)
Sodium: 140 mmol/L (ref 135–145)
Total Bilirubin: 0.8 mg/dL (ref 0.3–1.2)
Total Protein: 6.8 g/dL (ref 6.5–8.1)

## 2019-05-31 LAB — ETHANOL: Alcohol, Ethyl (B): <10 mg/dL (ref <10)

## 2019-05-31 MED ORDER — NALOXONE HCL 0.4 MG/ML IJ SOLN
0.4000 mg | Freq: Once | INTRAMUSCULAR | Status: AC
  Administered 2019-05-31: 0.4 mg via INTRAVENOUS
  Filled 2019-05-31: qty 1

## 2019-05-31 MED ORDER — LAMOTRIGINE 100 MG PO TABS: 100.0000 mg | ORAL_TABLET | Freq: Two times a day (BID) | ORAL | 3 refills | Status: DC

## 2019-05-31 MED ORDER — LAMOTRIGINE 100 MG PO TABS
100.0000 mg | ORAL_TABLET | ORAL | Status: AC
  Administered 2019-05-31: 100 mg via ORAL
  Filled 2019-05-31: qty 1

## 2019-05-31 MED ORDER — SODIUM CHLORIDE 0.9 % IV BOLUS
1000.0000 mL | Freq: Once | INTRAVENOUS | Status: AC
  Administered 2019-05-31: 1000 mL via INTRAVENOUS

## 2019-05-31 NOTE — Discharge Instructions (Addendum)
Not take any illicit substances Take your Lamictal as prescribed Please follow-up with Dr. Mickeal Skinner as previously scheduled, or call to make an appointment

## 2019-05-31 NOTE — ED Triage Notes (Signed)
Patient arrived with [employer] officer from jail handcuffed , sent here for medical clearance , intoxicated with ETOH , patient somnolent /sleepy at arrival , patient stated " I'm tired" / poor historian

## 2019-05-31 NOTE — ED Notes (Signed)
Pt more alert and ready for DC.  To be transported by [employer].  Pt does admit to pooping multiple CBD gummies prior to arrest to help him go to sleep.  Cooperative and calm.

## 2019-05-31 NOTE — ED Provider Notes (Signed)
[facility] EMERGENCY DEPARTMENT Provider Note   CSN: [id number] Arrival date & time: 05/31/19  [id number]     History Chief Complaint  Patient presents with  . Medical Clearance: [employer] custody from jail -Intoxicated    Marco Scott is a 24 y.o. male. Level 5 caveat secondary to altered mental status HPI 24 year old male presents today via [employer] with reports that he overdosed.  [facility] reports that they picked him up on charges.  He then told them that he had taken something.  He has been in custody, but in the waiting room last night.  Police believe he was picked up approximately 4 to 5 hours prior to my evaluation.  Officer with him reports that the patient has been quite sleepy and less responsive.  No report of trauma is given. 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    Past Medical History:  Diagnosis Date  . Brain tumor ([place]) 03/21/2017  . Seizure ([place])    FOR LAST 4 MONTHS    Patient Active Problem List   Diagnosis Date Noted  . 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 Brain mass   . Seizure ([place]) 12/17/2016  . Marijuana abuse   . Tobacco use disorder   . Anemia     Past Surgical History:  Procedure Laterality Date  . APPLICATION OF CRANIAL NAVIGATION N/A 03/21/2017   Procedure: APPLICATION OF CRANIAL NAVIGATION;  Surgeon: Erline Levine, MD;  Location: [facility];  Service: Neurosurgery;  Laterality: N/A;  . CRANIOTOMY Left 03/21/2017   Procedure: Left frontal craniotomy for brain tumor with Brainlab;  Surgeon: Erline Levine, MD;  Location: [facility];  Service: Neurosurgery;  Laterality: Left;  left frontal  . TOOTH EXTRACTION         No family history on file.  Social History   Tobacco Use  . Smoking status: Current Some Day Smoker    Types: Cigarettes  . Smokeless tobacco: Never  Used  Substance Use Topics  . Alcohol use: No  . Drug use: Yes    Types: Marijuana    Home Medications Prior to Admission medications   Medication Sig Start Date End Date Taking? Authorizing Provider  ibuprofen (ADVIL,MOTRIN) 800 MG tablet Take 1 tablet (800 mg total) by mouth every 8 (eight) hours as needed (for severe pain 8-10). Patient not taking: Reported on 12/04/2018 03/30/17   Ventura Sellers, MD  lamoTRIgine (LAMICTAL) 100 MG tablet Take 1 tablet (100 mg total) by mouth 2 (two) times daily. 12/04/18   Ventura Sellers, MD    Allergies    Gadolinium derivatives  Review of Systems   Review of Systems  Physical Exam Updated Vital Signs BP (!) 100/51 (BP Location: Left Arm)   Pulse 72   Temp (!) 97.5 F (36.4 C)   Resp 18   Ht 1.778 m (5\' 10" )   Wt 65.8 kg   SpO2 100%   BMI 20.81 kg/m   Physical Exam Vitals and nursing note reviewed.  Constitutional:      Appearance: He is well-developed.  HENT:     Head: Normocephalic and atraumatic.     Right Ear: External ear normal.     Left Ear: External ear normal.     Nose: Nose normal.  Eyes:     Conjunctiva/sclera: Conjunctivae normal.     Pupils: Pupils  are equal, round, and reactive to light.  Cardiovascular:     Rate and Rhythm: Normal rate and regular rhythm.     Heart sounds: Normal heart sounds.  Pulmonary:     Effort: Pulmonary effort is normal.     Breath sounds: Normal breath sounds.  Abdominal:     General: Bowel sounds are normal.     Palpations: Abdomen is soft.  Musculoskeletal:        General: Normal range of motion.     Cervical back: Normal range of motion and neck supple.  Skin:    General: Skin is warm and dry.  Neurological:     Mental Status: He is oriented to person, place, and time. He is lethargic.     Cranial Nerves: Cranial nerves are intact.     Sensory: Sensation is intact.     Motor: Motor function is intact.     Deep Tendon Reflexes: Reflexes are normal and symmetric.    Psychiatric:        Attention and Perception: He is inattentive.        Mood and Affect: Affect is blunt.        Speech: He is noncommunicative. Speech is delayed.        Behavior: Behavior is slowed and withdrawn.        Thought Content: Thought content normal.        Judgment: Judgment normal.     ED Results / Procedures / Treatments   Labs (all labs ordered are listed, but only abnormal results are displayed) Labs Reviewed  COMPREHENSIVE METABOLIC PANEL - Abnormal; Notable for the following components:      Result Value   Glucose, Bld 102 (*)    All other components within normal limits  CBC - Abnormal; Notable for the following components:   RBC 3.96 (*)    Hemoglobin 12.9 (*)    MCV 100.3 (*)    All other components within normal limits  ETHANOL  RAPID URINE DRUG SCREEN, HOSP PERFORMED    EKG EKG Interpretation  Date/Time:  Friday May 31 2019 08:12:55 EDT Ventricular Rate:  50 PR Interval:    QRS Duration: 85 QT Interval:  389 QTC Calculation: 355 R Axis:   96 Text Interpretation: Normal sinus rhythm Although rate has decreased since last tracing Confirmed by Pattricia Boss [id number]) on 05/31/2019 9:29:54 AM   Radiology CT Head Wo Contrast  Result Date: 05/31/2019 CLINICAL DATA:  Altered mental status. EXAM: CT HEAD WITHOUT CONTRAST TECHNIQUE: Contiguous axial images were obtained from the base of the skull through the vertex without intravenous contrast. COMPARISON:  MR brain dated August 15, 2018. CT head dated December 18, 2017. FINDINGS: Brain: No evidence of acute infarction, hemorrhage, hydrocephalus, extra-axial collection or mass lesion/mass effect. Stable small area of postsurgical changes in left superior frontal gyrus related to prior tumor resection. Vascular: No hyperdense vessel or unexpected calcification. Skull: Prior left frontal craniotomy. Negative for fracture or focal lesion. Sinuses/Orbits: No acute finding. Other: None. IMPRESSION: 1. No acute  intracranial abnormality. 2. Stable postsurgical changes in left superior frontal gyrus related to prior tumor resection. Electronically Signed   By: Titus Dubin M.D.   On: 05/31/2019 13:05    Procedures .Critical Care Performed by: Pattricia Boss, MD Authorized by: Pattricia Boss, MD   Critical care provider statement:    Critical care time (minutes):  45   Critical care end time:  05/31/2019 1:24 PM   Critical care was necessary to treat  or prevent imminent or life-threatening deterioration of the following conditions:  CNS failure or compromise   Critical care was time spent personally by me on the following activities:  Discussions with consultants, evaluation of patient's response to treatment, examination of patient, ordering and performing treatments and interventions, ordering and review of laboratory studies, ordering and review of radiographic studies, pulse oximetry, re-evaluation of patient's condition, obtaining history from patient or surrogate and review of old charts Comments:     Patient evaluated and reevaluated on multiple occasions to assure that he is neurologically intact.  Patient also had history of cerebral tumor and required imaging to evaluate.   (including critical care time)  Medications Ordered in ED Medications  sodium chloride 0.9 % bolus 1,000 mL (has no administration in time range)  naloxone (NARCAN) injection 0.4 mg (has no administration in time range)    ED Course  I have reviewed the triage vital signs and the nursing notes.  Pertinent labs & imaging results that were available during my care of the patient were reviewed by me and considered in my medical decision making (see chart for details). 8:49 AM Patient given Narcan.  He does not appear any more awake after the Narcan.  He is minimally interacting verbally, but slurring words and then not speaking for periods of time with certain questions.  He did give [place] the verbal okay to speak with his  aunt Oris Drone.  We attempted to call her all numbers listed but were not able to connect.  He does deny that he is taking any of his medicines including his Lamictal. 8:55 AM Discussed with Dr. Mickeal Skinner.  He advises very unlikely to see tumor on the head CT, but is not contraindicated.  We would likely see if there is are other causes of pathology or hemorrhage or any significant change. Additionally, as patient has not been taking his Lamictal, we will restart at 100 mg here in ED. Clinical Course as of May 30 1313  Fri May 31, 2019  1033 UDS reviewed and noted positive for benzos and [facility]   [DR]    Clinical Course User Index [DR] Pattricia Boss, MD   MDM Rules/Calculators/A&P                      24 year old male police custody took overdose.  Urine drug screen here positive for benzos and THC.  He has been sleeping and remained hemodynamically stable.  Medically he appears cleared with head CT with prior resection.  Care was discussed with Dr. Mickeal Skinner.  He is also given his usual dose of seizure medications.  Although, there was no history of seizure.  Will observe for return to baseline he may be discharged in police custody And awakened.  He asks if he will still be going to jail.  I am writing a prescription for Lamictal and please officer states that he will be given this while he is in custody.  He will be given the prescription after he is released. Final Clinical Impression(s) / ED Diagnoses Final diagnoses:  Benzodiazepine overdose of undetermined intent, initial encounter    Rx / DC Orders ED Discharge Orders    None       Pattricia Boss, MD 05/31/19 1325

## 2019-05-31 NOTE — ED Notes (Signed)
Unable to make contact with patient's emergency contact, Dickie La. EDP made aware.

## 2019-05-31 NOTE — ED Notes (Signed)
CT imaging dept. Called advised they're on their way to take Mr. Marco Scott to imaging. EDP notified.

## 2019-08-10 ENCOUNTER — Encounter: Payer: Self-pay | Admitting: Internal Medicine

## 2019-08-12 ENCOUNTER — Telehealth: Payer: Self-pay | Admitting: Internal Medicine

## 2019-08-12 NOTE — Telephone Encounter (Signed)
Scheduled per 7/26 sch message. Pt is aware of appt time and date.

## 2019-08-15 ENCOUNTER — Inpatient Hospital Stay: Payer: Medicaid Other | Admitting: Internal Medicine

## 2019-08-15 ENCOUNTER — Other Ambulatory Visit: Payer: Self-pay | Admitting: *Deleted

## 2019-08-15 DIAGNOSIS — D332 Benign neoplasm of brain, unspecified: Secondary | ICD-10-CM

## 2019-08-30 ENCOUNTER — Encounter: Payer: Self-pay | Admitting: Internal Medicine

## 2019-09-03 ENCOUNTER — Ambulatory Visit (HOSPITAL_COMMUNITY): Admission: RE | Admit: 2019-09-03 | Payer: Medicaid Other | Source: Ambulatory Visit

## 2020-04-01 ENCOUNTER — Telehealth: Payer: Self-pay | Admitting: Internal Medicine

## 2020-04-01 NOTE — Telephone Encounter (Signed)
Scheduled appointment per 3/14 sch msg. Called patient, no answer and no voicemail available. Mailed updated calendar to patient.

## 2020-04-13 ENCOUNTER — Inpatient Hospital Stay: Payer: Medicaid Other | Attending: Internal Medicine | Admitting: Internal Medicine

## 2020-04-13 DIAGNOSIS — D496 Neoplasm of unspecified behavior of brain: Secondary | ICD-10-CM | POA: Insufficient documentation

## 2020-04-13 DIAGNOSIS — F17210 Nicotine dependence, cigarettes, uncomplicated: Secondary | ICD-10-CM | POA: Insufficient documentation

## 2020-04-13 DIAGNOSIS — Z79899 Other long term (current) drug therapy: Secondary | ICD-10-CM | POA: Insufficient documentation

## 2020-04-13 DIAGNOSIS — F1290 Cannabis use, unspecified, uncomplicated: Secondary | ICD-10-CM | POA: Insufficient documentation

## 2020-04-13 DIAGNOSIS — G40109 Localization-related (focal) (partial) symptomatic epilepsy and epileptic syndromes with simple partial seizures, not intractable, without status epilepticus: Secondary | ICD-10-CM | POA: Insufficient documentation

## 2020-04-13 DIAGNOSIS — F419 Anxiety disorder, unspecified: Secondary | ICD-10-CM | POA: Insufficient documentation

## 2020-04-15 ENCOUNTER — Telehealth: Payer: Self-pay | Admitting: Internal Medicine

## 2020-04-15 NOTE — Telephone Encounter (Signed)
Scheduled appt per 3/30 sch msg. Pt aware.  

## 2020-04-16 ENCOUNTER — Other Ambulatory Visit: Payer: Self-pay

## 2020-04-16 ENCOUNTER — Inpatient Hospital Stay (HOSPITAL_BASED_OUTPATIENT_CLINIC_OR_DEPARTMENT_OTHER): Payer: Medicaid Other | Admitting: Internal Medicine

## 2020-04-16 VITALS — BP 105/65 | HR 97 | Temp 97.1°F | Resp 20 | Ht 70.0 in | Wt 106.0 lb

## 2020-04-16 DIAGNOSIS — R569 Unspecified convulsions: Secondary | ICD-10-CM

## 2020-04-16 DIAGNOSIS — F17210 Nicotine dependence, cigarettes, uncomplicated: Secondary | ICD-10-CM | POA: Diagnosis not present

## 2020-04-16 DIAGNOSIS — G40109 Localization-related (focal) (partial) symptomatic epilepsy and epileptic syndromes with simple partial seizures, not intractable, without status epilepticus: Secondary | ICD-10-CM | POA: Diagnosis not present

## 2020-04-16 DIAGNOSIS — Z79899 Other long term (current) drug therapy: Secondary | ICD-10-CM | POA: Diagnosis not present

## 2020-04-16 DIAGNOSIS — D332 Benign neoplasm of brain, unspecified: Secondary | ICD-10-CM | POA: Diagnosis not present

## 2020-04-16 DIAGNOSIS — F419 Anxiety disorder, unspecified: Secondary | ICD-10-CM | POA: Diagnosis not present

## 2020-04-16 DIAGNOSIS — D496 Neoplasm of unspecified behavior of brain: Secondary | ICD-10-CM | POA: Diagnosis not present

## 2020-04-16 DIAGNOSIS — F1290 Cannabis use, unspecified, uncomplicated: Secondary | ICD-10-CM | POA: Diagnosis not present

## 2020-04-16 MED ORDER — LAMOTRIGINE 100 MG PO TABS: ORAL_TABLET | ORAL | 3 refills | Status: DC

## 2020-04-16 NOTE — Progress Notes (Signed)
[facility] [place], [place] [address] [phone]   Interval Evaluation  Date of Service: 04/16/20 Patient Name: Marco Scott Patient MRN: [id number] Patient DOB: [birth year] Provider: Ventura Sellers, MD  Identifying Statement:  Marco Scott is a 25 y.o. male with left frontal DNET   Interval History:  Marco Scott presents today for follow up. He describes several breakthrough seizures despite full compliance with Lamictal 100mg  twice per day.  Semiology is consistent with prior events, they tend to be nocturnal.  Frequency is ~2 per month at this time.  Otherwise no new or progressive neurologic deficits.    Medications: Current Outpatient Medications on File Prior to Visit  Medication Sig Dispense Refill  . methocarbamol (ROBAXIN) 500 MG tablet Take 500 mg by mouth every 6 (six) hours as needed for muscle spasms.    Marland Kitchen ibuprofen (ADVIL,MOTRIN) 800 MG tablet Take 1 tablet (800 mg total) by mouth every 8 (eight) hours as needed (for severe pain 8-10). (Patient not taking: Reported on 12/04/2018) 30 tablet 2   No current facility-administered medications on file prior to visit.    Allergies:  Allergies  Allergen Reactions  . Gadolinium Derivatives Nausea And Vomiting   Past Medical History:  Past Medical History:  Diagnosis Date  . Brain tumor ([employer]) 03/21/2017  . Seizure ([place])    FOR LAST 4 MONTHS   Past Surgical History:  Past Surgical History:  Procedure Laterality Date  . APPLICATION OF CRANIAL NAVIGATION N/A 03/21/2017   Procedure: APPLICATION OF CRANIAL NAVIGATION;  Surgeon: Erline Levine, MD;  Location: [facility];  Service: Neurosurgery;  Laterality: N/A;  . CRANIOTOMY Left 03/21/2017   Procedure: Left frontal craniotomy for brain tumor with Brainlab;  Surgeon: Erline Levine, MD;  Location: [facility];  Service: Neurosurgery;  Laterality: Left;  left frontal  . TOOTH EXTRACTION     Social History:  Social History    Socioeconomic History  . Marital status: Single    Spouse name: Not on file  . Number of children: Not on file  . Years of education: Not on file  . Highest education level: Not on file  Occupational History  . Not on file  Tobacco Use  . Smoking status: Current Some Day Smoker    Types: Cigarettes  . Smokeless tobacco: Never Used  Vaping Use  . Vaping Use: Every day  Substance and Sexual Activity  . Alcohol use: No  . Drug use: Yes    Types: Marijuana  . Sexual activity: Not Currently  Other Topics Concern  . Not on file  Social History Narrative   Currently on house arrest and wearing ankle monitor   Social Determinants of Health   Financial Resource Strain: Not on file  Food Insecurity: Not on file  Transportation Needs: Not on file  Physical Activity: Not on file  Stress: Not on file  Social Connections: Not on file  Intimate Partner Violence: Not on file   Family History: No family history on file.  Review of Systems: Constitutional: Denies fevers, chills or abnormal weight loss Eyes: Denies blurriness of vision Ears, nose, mouth, throat, and face: Denies mucositis or sore throat Respiratory: Denies cough, dyspnea or wheezes Cardiovascular: Denies palpitation, chest discomfort or lower extremity swelling Gastrointestinal:  Denies nausea, constipation, diarrhea GU: Denies dysuria or incontinence Skin: Denies abnormal skin rashes Neurological: Per HPI Musculoskeletal: Denies joint pain, back or neck discomfort. No decrease in ROM Behavioral/Psych: ++anxiety  Physical Exam:  Vitals:   04/16/20 0858  BP: 105/65  Pulse: 97  Resp: 20  Temp: (!) 97.1 F (36.2 C)  SpO2: 100%   KPS: 100. General: Alert, cooperative, pleasant, in no acute distress Head: Craniotomy scar, C/D/I EENT: No conjunctival injection or scleral icterus. Oral mucosa moist Lungs: Resp effort normal Cardiac: Regular rate and rhythm Abdomen: Soft, non-distended abdomen Skin: No  rashes cyanosis or petechiae. Extremities: No clubbing or edema  Neurologic Exam: Mental Status: Awake, alert, attentive to examiner. Oriented to self and environment. Language is fluent with intact comprehension.  Cranial Nerves: Visual acuity is grossly normal. Visual fields are full. Extra-ocular movements intact. No ptosis. Face is symmetric, tongue midline. Motor: Tone and bulk are normal. Power is full in both arms and legs. Reflexes are symmetric, no pathologic reflexes present. Intact finger to nose bilaterally Sensory: Intact to light touch and temperature Gait: Normal and tandem gait is normal.   Labs: I have reviewed the data as listed    Component Value Date/Time   NA 140 05/31/2019 0342   K 4.2 05/31/2019 0342   CL 106 05/31/2019 0342   CO2 25 05/31/2019 0342   GLUCOSE 102 (H) 05/31/2019 0342   BUN 7 05/31/2019 0342   CREATININE 0.88 05/31/2019 0342   CALCIUM 9.3 05/31/2019 0342   PROT 6.8 05/31/2019 0342   ALBUMIN 3.9 05/31/2019 0342   AST 19 05/31/2019 0342   ALT 12 05/31/2019 0342   ALKPHOS 48 05/31/2019 0342   BILITOT 0.8 05/31/2019 0342   GFRNONAA >60 05/31/2019 0342   GFRAA >60 05/31/2019 0342   Lab Results  Component Value Date   WBC 7.5 05/31/2019   NEUTROABS 4.9 12/17/2016   HGB 12.9 (L) 05/31/2019   HCT 39.7 05/31/2019   MCV 100.3 (H) 05/31/2019   PLT 267 05/31/2019    Assessment/Plan 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  We recommended he increase Lamictal to 300mg  daily (100mg  in AM, 200mg  in PM due to mostly nocturnal events).  We strongly advised he follow through on behavioral health appointments given high degree of anxiety.    We appreciate the opportunity to participate in the care of Marco Scott.  We will touch base with him via phone in 1 month to assess response to higher dose of Lamictal.  We ask that Marco Scott return to in-person clinic in 6 months following next brain MRI, or sooner as needed.  All questions were answered. The patient knows to call the clinic with any problems, questions or concerns. No barriers to learning were detected.  The total time spent in the encounter was 30 minutes and more than 50% was on counseling and review of test results   Ventura Sellers, MD Medical Director of Neuro-Oncology [facility] [place] 04/16/20 10:22 AM

## 2020-04-20 ENCOUNTER — Other Ambulatory Visit: Payer: Self-pay | Admitting: *Deleted

## 2020-04-20 DIAGNOSIS — D332 Benign neoplasm of brain, unspecified: Secondary | ICD-10-CM

## 2020-04-21 ENCOUNTER — Telehealth: Payer: Self-pay | Admitting: Internal Medicine

## 2020-04-21 NOTE — Telephone Encounter (Signed)
Scheduled per los. Called and spoke with patient. Confirmed appt 

## 2020-05-14 ENCOUNTER — Inpatient Hospital Stay: Payer: Medicaid Other | Attending: Internal Medicine | Admitting: Internal Medicine

## 2020-05-14 ENCOUNTER — Inpatient Hospital Stay: Payer: Medicaid Other | Admitting: Internal Medicine

## 2020-05-14 DIAGNOSIS — D332 Benign neoplasm of brain, unspecified: Secondary | ICD-10-CM

## 2020-05-14 DIAGNOSIS — R569 Unspecified convulsions: Secondary | ICD-10-CM | POA: Diagnosis not present

## 2020-05-14 NOTE — Progress Notes (Signed)
I connected with Marco Scott on 05/14/20 at  9:00 AM EDT by telephone visit and verified that I am speaking with the correct person using two identifiers.  I discussed the limitations, risks, security and privacy concerns of performing an evaluation and management service by telemedicine and the availability of in-person appointments. I also discussed with the patient that there may be a patient responsible charge related to this service. The patient expressed understanding and agreed to proceed.  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  No further events noted.  Has run out of medication in the past few days, fortunately no breakthrough events.  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  Will con't 100/200 dose level.  Follow Up Instructions: F/u in 6 months after MRI as scheduled  I discussed the assessment and treatment plan with the patient.  The patient was provided an opportunity to ask questions and all were answered.  The patient agreed with the plan and demonstrated understanding of the instructions.    The patient was advised to call back or seek an in-person evaluation if the symptoms worsen or if the condition fails to improve as anticipated.  I provided 5-10 minutes of non-face-to-face time during this enocunter.  Ventura Sellers, MD   I provided 15 minutes of non face-to-face telephone visit time during this encounter, and > 50% was spent counseling as documented under my  assessment & plan.

## 2020-09-29 ENCOUNTER — Encounter: Payer: Self-pay | Admitting: Internal Medicine

## 2020-09-29 ENCOUNTER — Encounter: Payer: Self-pay | Admitting: *Deleted

## 2020-10-01 ENCOUNTER — Telehealth: Payer: Self-pay | Admitting: Internal Medicine

## 2020-10-01 NOTE — Telephone Encounter (Signed)
Scheduled per sch msg. Called, not able to leave msg

## 2020-10-05 ENCOUNTER — Other Ambulatory Visit: Payer: Self-pay

## 2020-10-05 ENCOUNTER — Inpatient Hospital Stay: Payer: Medicaid Other | Attending: Internal Medicine | Admitting: Internal Medicine

## 2020-10-05 VITALS — BP 99/66 | HR 88 | Temp 99.0°F | Resp 18 | Wt 103.5 lb

## 2020-10-05 DIAGNOSIS — Z9114 Patient's other noncompliance with medication regimen: Secondary | ICD-10-CM | POA: Diagnosis not present

## 2020-10-05 DIAGNOSIS — G40109 Localization-related (focal) (partial) symptomatic epilepsy and epileptic syndromes with simple partial seizures, not intractable, without status epilepticus: Secondary | ICD-10-CM | POA: Diagnosis not present

## 2020-10-05 DIAGNOSIS — R569 Unspecified convulsions: Secondary | ICD-10-CM

## 2020-10-05 DIAGNOSIS — Z79899 Other long term (current) drug therapy: Secondary | ICD-10-CM | POA: Insufficient documentation

## 2020-10-05 MED ORDER — DIVALPROEX SODIUM 250 MG PO DR TAB: 250.0000 mg | DELAYED_RELEASE_TABLET | Freq: Three times a day (TID) | ORAL | 2 refills | Status: AC

## 2020-10-05 NOTE — Progress Notes (Signed)
[facility] [place], [place] [address] [phone]   Interval Evaluation  Date of Service: 10/05/20 Patient Name: Marco Scott Patient MRN: [id number] Patient DOB: 09-Feb-1995 Provider: Ventura Sellers, MD  Identifying Statement:  Marco Scott is a 25 y.o. male with left frontal  DNET    Interval History:  Marco Scott presents today for follow up. He describes additional breakthrough seizures the past several weeks, at least 3 witnessed by his girlfriend.  He has not been compliant with Lamictal because "doesn't like how it makes me feel".  Semiology is consistent with prior events, they tend to be nocturnal.  Otherwise no new or progressive neurologic deficits.  He is currently enrolled in community college.   Medications: Current Outpatient Medications on File Prior to Visit  Medication Sig Dispense Refill   ibuprofen (ADVIL,MOTRIN) 800 MG tablet Take 1 tablet (800 mg total) by mouth every 8 (eight) hours as needed (for severe pain 8-10). (Patient not taking: No sig reported) 30 tablet 2   lamoTRIgine (LAMICTAL) 100 MG tablet Take 100mg  in AM, 200mg  in PM (Patient not taking: Reported on 10/05/2020) 90 tablet 3   methocarbamol (ROBAXIN) 500 MG tablet Take 500 mg by mouth every 6 (six) hours as needed for muscle spasms. (Patient not taking: Reported on 10/05/2020)     No current facility-administered medications on file prior to visit.    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   Seizure ([place])    FOR LAST 4 MONTHS   Past Surgical History:  Past Surgical History:  Procedure Laterality Date   APPLICATION OF CRANIAL NAVIGATION N/A 03/21/2017   Procedure: APPLICATION OF CRANIAL NAVIGATION;  Surgeon: Erline Levine, MD;  Location: [facility];  Service: Neurosurgery;  Laterality: N/A;    CRANIOTOMY Left 03/21/2017   Procedure: Left frontal craniotomy for brain tumor with Brainlab;  Surgeon: Erline Levine, MD;  Location: [facility];  Service: Neurosurgery;  Laterality: Left;  left frontal   TOOTH EXTRACTION     Social History:  Social History   Socioeconomic History   Marital status: Single    Spouse name: Not on file   Number of children: Not on file   Years of education: Not on file   Highest education level: Not on file  Occupational History   Not on file  Tobacco Use   Smoking status: Some Days    Types: Cigarettes   Smokeless tobacco: Never  Vaping Use   Vaping Use: Every day  Substance and Sexual Activity   Alcohol use: No   Drug use: Yes    Types: Marijuana   Sexual activity: Not Currently  Other Topics Concern   Not on file  Social History Narrative   Currently on house arrest and wearing ankle monitor   Social Determinants of Health   Financial Resource Strain: Not on file  Food Insecurity: Not on file  Transportation Needs: Not on file  Physical Activity: Not on file  Stress: Not on file  Social Connections: Not on file  Intimate Partner Violence: Not on file   Family History: No family history on file.  Review of Systems: Constitutional: Denies fevers, chills or abnormal weight loss Eyes: Denies blurriness of vision Ears, nose, mouth, throat, and face: Denies mucositis or sore throat Respiratory: Denies cough, dyspnea or wheezes Cardiovascular:  Denies palpitation, chest discomfort or lower extremity swelling Gastrointestinal:  Denies nausea, constipation, diarrhea GU: Denies dysuria or incontinence Skin: Denies abnormal skin rashes Neurological: Per HPI Musculoskeletal: Denies joint pain, back or neck discomfort. No decrease in ROM Behavioral/Psych: ++anxiety  Physical Exam: Vitals:   10/05/20 1134  BP: 99/66  Pulse: 88  Resp: 18  Temp: 99 F (37.2 C)  SpO2: 99%   KPS: 100. General: Alert, cooperative, pleasant, in no acute  distress Head: Craniotomy scar, C/D/I EENT: No conjunctival injection or scleral icterus. Oral mucosa moist Lungs: Resp effort normal Cardiac: Regular rate and rhythm Abdomen: Soft, non-distended abdomen Skin: No rashes cyanosis or petechiae. Extremities: No clubbing or edema  Neurologic Exam: Mental Status: Awake, alert, attentive to examiner. Oriented to self and environment. Language is fluent with intact comprehension.  Cranial Nerves: Visual acuity is grossly normal. Visual fields are full. Extra-ocular movements intact. No ptosis. Face is symmetric, tongue midline. Motor: Tone and bulk are normal. Power is full in both arms and legs. Reflexes are symmetric, no pathologic reflexes present. Intact finger to nose bilaterally Sensory: Intact to light touch and temperature Gait: Normal and tandem gait is normal.   Labs: I have reviewed the data as listed    Component Value Date/Time   NA 140 05/31/2019 0342   K 4.2 05/31/2019 0342   CL 106 05/31/2019 0342   CO2 25 05/31/2019 0342   GLUCOSE 102 (H) 05/31/2019 0342   BUN 7 05/31/2019 0342   CREATININE 0.88 05/31/2019 0342   CALCIUM 9.3 05/31/2019 0342   PROT 6.8 05/31/2019 0342   ALBUMIN 3.9 05/31/2019 0342   AST 19 05/31/2019 0342   ALT 12 05/31/2019 0342   ALKPHOS 48 05/31/2019 0342   BILITOT 0.8 05/31/2019 0342   GFRNONAA >60 05/31/2019 0342   GFRAA >60 05/31/2019 0342   Lab Results  Component Value Date   WBC 7.5 05/31/2019   NEUTROABS 4.9 12/17/2016   HGB 12.9 (L) 05/31/2019   HCT 39.7 05/31/2019   MCV 100.3 (H) 05/31/2019   PLT 267 05/31/2019    Assessment/Plan 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  He does not want to resume Lamictal, but is agreeable to trial of alternate AED.  We recommended initiation of Depakote 750mg  BID.  We reviewed side effects including weight gain, liver toxicity.    He  understand Marco Scott driving laws regarding epilepsy, not safe to drive for 6 months after a seizure.    We appreciate the opportunity to participate in the care of Marco Scott.    We ask that Conrad Zajkowski return to in-person clinic in 1 months following scheduled brain MRI.  We will add-on a Depakote level prior to the visit.  All questions were answered. The patient knows to call the clinic with any problems, questions or concerns. No barriers to learning were detected.  The total time spent in the encounter was 30 minutes and more than 50% was on counseling and review of test results   Ventura Sellers, MD Medical Director of Neuro-Oncology [facility] [place] 10/05/20 11:45 AM

## 2020-10-12 ENCOUNTER — Other Ambulatory Visit: Payer: Self-pay | Admitting: Radiation Therapy

## 2020-10-14 ENCOUNTER — Telehealth: Payer: Self-pay | Admitting: Dietician

## 2020-10-14 NOTE — Telephone Encounter (Signed)
Nutrition  Patient identified on Malnutrition Screening Tool Report.  Attempted to contact patient via telephone to introduce self and services available at [facility]. Patient did not answer. Left message with request for return call on VM. Contact information provided.

## 2020-10-20 ENCOUNTER — Other Ambulatory Visit: Payer: Self-pay | Admitting: Internal Medicine

## 2020-10-20 ENCOUNTER — Ambulatory Visit (HOSPITAL_COMMUNITY)
Admission: RE | Admit: 2020-10-20 | Discharge: 2020-10-20 | Disposition: A | Discharge status: Home / Self Care | Payer: Medicaid Other | Source: Ambulatory Visit | Attending: Internal Medicine | Admitting: Internal Medicine

## 2020-10-20 ENCOUNTER — Encounter (HOSPITAL_COMMUNITY): Payer: Self-pay

## 2020-10-20 ENCOUNTER — Other Ambulatory Visit: Payer: Self-pay

## 2020-10-20 DIAGNOSIS — D332 Benign neoplasm of brain, unspecified: Secondary | ICD-10-CM

## 2020-10-20 MED ORDER — PREDNISONE 50 MG PO TABS
ORAL_TABLET | ORAL | 0 refills | Status: DC
  Filled 2020-10-21: qty 3, 1d supply, fill #0

## 2020-10-20 MED ORDER — DIPHENHYDRAMINE HCL 50 MG PO TABS
50.0000 mg | ORAL_TABLET | Freq: Once | ORAL | 0 refills | Status: AC
Start: 2020-10-20 — End: 2020-10-20

## 2020-10-21 ENCOUNTER — Telehealth: Payer: Self-pay | Admitting: Internal Medicine

## 2020-10-21 ENCOUNTER — Other Ambulatory Visit (HOSPITAL_COMMUNITY): Payer: Self-pay

## 2020-10-21 NOTE — Telephone Encounter (Signed)
Sch per 9/19 los, pt aware

## 2020-10-22 ENCOUNTER — Inpatient Hospital Stay: Payer: Medicaid Other | Attending: Internal Medicine | Admitting: Internal Medicine

## 2020-10-22 ENCOUNTER — Inpatient Hospital Stay: Payer: Medicaid Other

## 2020-10-22 ENCOUNTER — Other Ambulatory Visit (HOSPITAL_COMMUNITY): Payer: Self-pay

## 2020-10-22 ENCOUNTER — Ambulatory Visit (HOSPITAL_COMMUNITY): Admission: RE | Admit: 2020-10-22 | Payer: Medicaid Other | Source: Ambulatory Visit

## 2020-10-25 ENCOUNTER — Ambulatory Visit (HOSPITAL_COMMUNITY): Admission: RE | Admit: 2020-10-25 | Payer: Medicaid Other | Source: Ambulatory Visit

## 2020-10-26 ENCOUNTER — Inpatient Hospital Stay: Payer: Medicaid Other

## 2020-11-02 ENCOUNTER — Inpatient Hospital Stay: Payer: Medicaid Other

## 2020-11-02 ENCOUNTER — Inpatient Hospital Stay: Payer: Medicaid Other | Admitting: Internal Medicine

## 2020-12-05 ENCOUNTER — Other Ambulatory Visit: Payer: Self-pay | Admitting: Internal Medicine

## 2020-12-07 ENCOUNTER — Other Ambulatory Visit: Payer: Self-pay

## 2020-12-07 ENCOUNTER — Other Ambulatory Visit: Payer: Self-pay | Admitting: Internal Medicine

## 2020-12-07 MED ORDER — PREDNISONE 10 MG PO TABS: ORAL_TABLET | ORAL | 0 refills | Status: AC

## 2020-12-07 MED ORDER — PREDNISONE 50 MG PO TABS
ORAL_TABLET | ORAL | 0 refills | Status: DC
  Filled 2020-12-07: qty 3, fill #0

## 2020-12-07 NOTE — Telephone Encounter (Signed)
This is an over the counter medication.  Can be purchased as such.

## 2021-09-13 ENCOUNTER — Encounter: Payer: Self-pay | Admitting: Internal Medicine

## 2021-09-14 ENCOUNTER — Other Ambulatory Visit: Payer: Self-pay | Admitting: *Deleted

## 2021-09-14 DIAGNOSIS — D332 Benign neoplasm of brain, unspecified: Secondary | ICD-10-CM

## 2021-09-14 NOTE — Progress Notes (Signed)
Patient has relocated to [place].  Requested referral to Neurologist.  Dr Mickeal Skinner suggested [facility].  Fax # [phone]  Referral faxed via routing system.

## 2021-09-29 ENCOUNTER — Encounter: Payer: Self-pay | Admitting: Internal Medicine

## 2022-09-08 ENCOUNTER — Telehealth: Payer: Self-pay | Admitting: *Deleted

## 2022-09-08 NOTE — Telephone Encounter (Signed)
Returned PC to patient, he called earlier requesting a recommendation for a Careers adviser in [place], [place].  Dr Barbaraann Cao had recommended [facility] in August & September 2023.  Informed patient Dr Barbaraann Cao is still recommending [facility] & that a referral & records were faxed to them last year.  Phone number provided to patient to contact [facility], [phone].  Instructed patient to call us back if [facility] needs a new referral.  Patient verbalizes understanding.

## 2022-11-30 ENCOUNTER — Telehealth: Payer: Self-pay

## 2022-11-30 NOTE — Telephone Encounter (Signed)
TC from pt, requesting that Dr. Barbaraann Cao refer him to a neuro-oncologist near his current hometown of [place], [place], [place]. Pt states that Dr. Barbaraann Cao referred him to a neuro-oncologist in [place], [place] last year when he moved there, but he has not seen a neuro-oncology provider since seeing Dr. Barbaraann Cao due to insurance reasons. (He reports having "a few" seizures and going to the hospital and/or urgent care in [place] while living there, but he never saw the provider that Dr. Barbaraann Cao referred him to.) Pt states that he had to move to [place], [place] d/t financial reasons and he wants to establish care w/ a neuro-oncologist in this area. Informed pt that Dr. Barbaraann Cao would be notified of his request and this RN would f/u with him later today.

## 2022-11-30 NOTE — Telephone Encounter (Signed)
Regarding earlier phone call today, Dr. Barbaraann Cao suggests that pt try to find a neuro-oncologist affiliated with [facility] [place] or [facility]. Attempted TC to inform pt to try to find a provider at these places (or wherever he can) and then let Dr. Barbaraann Cao know so that a referral can be placed, but there was no answer. Left VM to call Dr. Liana Gerold office tomorrow.

## 2022-12-01 ENCOUNTER — Telehealth: Payer: Self-pay | Admitting: *Deleted

## 2022-12-01 NOTE — Telephone Encounter (Signed)
Returned PC to patient, reiterated yesterday's message, Dr Barbaraann Cao doesn't know of a neuro oncologist in the [place] to refer him to, suggests that he try [facility] [place] or [facility].  If he can get an appointment, we will send referral information to them.  Patient verbalizes understanding, states he will let us know who to send referral to.
